# Patient Record
Sex: Male | Born: 1970 | Race: Black or African American | Hispanic: No | Marital: Married | Smoking: Never smoker
Health system: Southern US, Community
[De-identification: ages and names within clinical notes are randomized; demographics above are authoritative.]

## PROBLEM LIST (undated history)

## (undated) DIAGNOSIS — Z789 Other specified health status: Secondary | ICD-10-CM

## (undated) HISTORY — DX: Other specified health status: Z78.9

## (undated) HISTORY — PX: WRIST SURGERY: SHX841

## (undated) HISTORY — PX: CHOLECYSTECTOMY: SHX55

## (undated) HISTORY — PX: KNEE SURGERY: SHX244

---

## 2009-06-06 ENCOUNTER — Emergency Department: Admit: 2009-06-06 | Payer: Self-pay | Source: Emergency Department | Admitting: Pediatric Emergency Medicine

## 2009-08-17 ENCOUNTER — Emergency Department: Admit: 2009-08-17 | Payer: Self-pay | Source: Emergency Department | Admitting: Emergency Medicine

## 2009-08-25 ENCOUNTER — Ambulatory Visit: Admission: RE | Admit: 2009-08-25 | Payer: Self-pay | Source: Ambulatory Visit | Admitting: Gastroenterology

## 2009-08-25 ENCOUNTER — Ambulatory Visit: Payer: Self-pay

## 2009-08-28 LAB — LAB USE ONLY - HISTORICAL SURGICAL PATHOLOGY

## 2009-09-04 ENCOUNTER — Ambulatory Visit
Admit: 2009-09-04 | Disposition: A | Discharge status: Home / Self Care | Payer: Self-pay | Source: Ambulatory Visit | Admitting: Gastroenterology

## 2009-09-13 ENCOUNTER — Ambulatory Visit
Admit: 2009-09-13 | Disposition: A | Discharge status: Home / Self Care | Payer: Self-pay | Source: Ambulatory Visit | Admitting: Gastroenterology

## 2009-09-13 LAB — GFR: EGFR: >60

## 2009-09-13 LAB — COMPREHENSIVE METABOLIC PANEL
ALT: 75 U/L — ABNORMAL HIGH (ref 0–55)
AST (SGOT): 46 U/L — ABNORMAL HIGH (ref 5–34)
Albumin/Globulin Ratio: 1.1 (ref 0.9–2.2)
Albumin: 3.8 g/dL (ref 3.5–5.0)
Alkaline Phosphatase: 66 U/L (ref 40–150)
BUN: 6 mg/dL — ABNORMAL LOW (ref 9.0–21.0)
Bilirubin, Total: 0.9 mg/dL (ref 0.2–1.2)
CO2: 25 mEq/L (ref 22–29)
Calcium: 10.1 mg/dL (ref 8.5–10.5)
Chloride: 102 mEq/L (ref 98–107)
Creatinine: 0.9 mg/dL (ref 0.7–1.3)
Globulin: 3.5 g/dL (ref 2.0–3.6)
Glucose: 101 mg/dL — ABNORMAL HIGH (ref 70–100)
Potassium: 4.8 mEq/L (ref 3.5–5.1)
Protein, Total: 7.3 g/dL (ref 6.0–8.3)
Sodium: 137 mEq/L (ref 136–145)

## 2009-09-13 LAB — HEMOLYSIS INDEX: Hemolysis Index: 22

## 2009-09-13 LAB — CBC
Hematocrit: 46.6 % (ref 42.0–52.0)
Hgb: 16.4 g/dL (ref 13.0–17.0)
MCH: 34.8 pg — ABNORMAL HIGH (ref 28.0–32.0)
MCHC: 35.2 g/dL (ref 32.0–36.0)
MCV: 98.9 fL (ref 80.0–100.0)
MPV: 9.7 fL (ref 9.4–12.3)
Platelets: 212 10*3/uL (ref 140–400)
RBC: 4.71 10*6/uL (ref 4.70–6.00)
RDW: 12 % (ref 12–15)
WBC: 4.09 10*3/uL (ref 3.50–10.80)

## 2009-09-13 LAB — AMYLASE: Amylase: 81 U/L (ref 25–125)

## 2009-09-13 LAB — LIPASE: Lipase: 113 U/L — ABNORMAL HIGH (ref 8–78)

## 2009-10-27 ENCOUNTER — Ambulatory Visit: Admission: RE | Admit: 2009-10-27 | Payer: Self-pay | Source: Ambulatory Visit | Admitting: Gastroenterology

## 2009-10-28 LAB — LAB USE ONLY - HISTORICAL SURGICAL PATHOLOGY

## 2009-12-31 ENCOUNTER — Emergency Department: Admit: 2009-12-31 | Payer: Self-pay | Source: Emergency Department | Admitting: Emergency Medicine

## 2009-12-31 LAB — CBC AND DIFFERENTIAL
Baso(Absolute): 0.02 10*3/uL (ref 0.00–0.20)
Basophils: 0 % (ref 0–2)
Eosinophils Absolute: 0.16 10*3/uL (ref 0.00–0.70)
Eosinophils: 3 % (ref 0–5)
Hematocrit: 42.8 % (ref 42.0–52.0)
Hgb: 15.6 g/dL (ref 13.0–17.0)
Immature Granulocytes Absolute: 0.03 10*3/uL
Immature Granulocytes: 1 % (ref 0–1)
Lymphocytes Absolute: 1.89 10*3/uL (ref 0.50–4.40)
Lymphocytes: 37 % (ref 15–41)
MCH: 34.7 pg — ABNORMAL HIGH (ref 28.0–32.0)
MCHC: 36.4 g/dL — ABNORMAL HIGH (ref 32.0–36.0)
MCV: 95.1 fL (ref 80.0–100.0)
MPV: 9.4 fL (ref 9.4–12.3)
Monocytes Absolute: 0.46 10*3/uL (ref 0.00–1.20)
Monocytes: 9 % (ref 0–11)
Neutrophils Absolute: 2.57 10*3/uL
Neutrophils: 50 % — ABNORMAL LOW (ref 52–75)
Platelets: 217 10*3/uL (ref 140–400)
RBC: 4.5 10*6/uL — ABNORMAL LOW (ref 4.70–6.00)
RDW: 13 % (ref 12–15)
WBC: 5.1 10*3/uL (ref 3.50–10.80)

## 2009-12-31 LAB — COMPREHENSIVE METABOLIC PANEL
ALT: 53 U/L (ref 0–55)
AST (SGOT): 36 U/L — ABNORMAL HIGH (ref 5–34)
Albumin/Globulin Ratio: 1.2 (ref 0.9–2.2)
Albumin: 3.8 g/dL (ref 3.5–5.0)
Alkaline Phosphatase: 63 U/L (ref 40–150)
BUN: 5 mg/dL — ABNORMAL LOW (ref 9.0–21.0)
Bilirubin, Total: 0.9 mg/dL (ref 0.2–1.2)
CO2: 24 mEq/L (ref 22–29)
Calcium: 9.6 mg/dL (ref 8.5–10.5)
Chloride: 102 mEq/L (ref 98–107)
Creatinine: 0.9 mg/dL (ref 0.7–1.3)
Globulin: 3.3 g/dL (ref 2.0–3.6)
Glucose: 100 mg/dL (ref 70–100)
Potassium: 4.1 mEq/L (ref 3.5–5.1)
Protein, Total: 7.1 g/dL (ref 6.0–8.3)
Sodium: 137 mEq/L (ref 136–145)

## 2009-12-31 LAB — HEMOLYSIS INDEX: Hemolysis Index: 15 Index (ref 0–18)

## 2009-12-31 LAB — LIPASE: Lipase: 42 U/L (ref 8–78)

## 2009-12-31 LAB — GFR: EGFR: >60

## 2010-01-03 ENCOUNTER — Ambulatory Visit
Admit: 2010-01-03 | Disposition: A | Discharge status: Home / Self Care | Payer: Self-pay | Source: Ambulatory Visit | Admitting: Family

## 2010-01-11 ENCOUNTER — Ambulatory Visit: Admission: RE | Admit: 2010-01-11 | Payer: Self-pay | Source: Ambulatory Visit | Admitting: Surgery

## 2010-01-12 LAB — LAB USE ONLY - HISTORICAL SURGICAL PATHOLOGY

## 2010-06-07 ENCOUNTER — Ambulatory Visit: Admission: RE | Admit: 2010-06-07 | Payer: Self-pay | Source: Ambulatory Visit | Admitting: Urology

## 2010-06-08 LAB — LAB USE ONLY - HISTORICAL SURGICAL PATHOLOGY

## 2010-07-18 ENCOUNTER — Emergency Department: Admit: 2010-07-18 | Payer: Self-pay | Source: Emergency Department | Admitting: Emergency Medicine

## 2010-07-19 LAB — COMPREHENSIVE METABOLIC PANEL
ALT: 49 U/L (ref 0–55)
AST (SGOT): 47 U/L — ABNORMAL HIGH (ref 5–34)
Albumin/Globulin Ratio: 0.9 (ref 0.9–2.2)
Albumin: 3.8 g/dL (ref 3.5–5.0)
Alkaline Phosphatase: 60 U/L (ref 40–150)
Anion Gap: 14 (ref 5.0–15.0)
BUN: 5 mg/dL — ABNORMAL LOW (ref 9.0–21.0)
Bilirubin, Total: 0.8 mg/dL (ref 0.2–1.2)
CO2: 21 mEq/L — ABNORMAL LOW (ref 22–29)
Calcium: 9.1 mg/dL (ref 8.5–10.5)
Chloride: 102 mEq/L (ref 98–107)
Creatinine: 0.9 mg/dL (ref 0.7–1.3)
Globulin: 4.4 g/dL — ABNORMAL HIGH (ref 2.0–3.6)
Glucose: 120 mg/dL — ABNORMAL HIGH (ref 70–100)
Potassium: 5.4 mEq/L — ABNORMAL HIGH (ref 3.5–5.1)
Protein, Total: 8.2 g/dL (ref 6.0–8.3)
Sodium: 137 mEq/L (ref 136–145)

## 2010-07-19 LAB — GFR: EGFR: >60

## 2010-07-19 LAB — LIPASE: Lipase: 35 U/L (ref 8–78)

## 2010-07-19 LAB — HEMOLYSIS INDEX: Hemolysis Index: 341 Index — ABNORMAL HIGH (ref 0–18)

## 2010-10-20 ENCOUNTER — Emergency Department: Admit: 2010-10-20 | Payer: Self-pay | Source: Emergency Department | Admitting: Emergency Medicine

## 2010-10-20 LAB — LIPASE: Lipase: 23 U/L (ref 8–78)

## 2010-10-20 LAB — COMPREHENSIVE METABOLIC PANEL
ALT: 37 U/L (ref 0–55)
AST (SGOT): 21 U/L (ref 5–34)
Albumin/Globulin Ratio: 1.2 (ref 0.9–2.2)
Albumin: 3.8 g/dL (ref 3.5–5.0)
Alkaline Phosphatase: 59 U/L (ref 40–150)
Anion Gap: 9 (ref 5.0–15.0)
BUN: 6 mg/dL — ABNORMAL LOW (ref 9.0–21.0)
Bilirubin, Total: 0.9 mg/dL (ref 0.2–1.2)
CO2: 24 mEq/L (ref 22–29)
Calcium: 9.4 mg/dL (ref 8.5–10.5)
Chloride: 103 mEq/L (ref 98–107)
Creatinine: 0.8 mg/dL (ref 0.7–1.3)
Globulin: 3.3 g/dL (ref 2.0–3.6)
Glucose: 106 mg/dL — ABNORMAL HIGH (ref 70–100)
Potassium: 4 mEq/L (ref 3.5–5.1)
Protein, Total: 7.1 g/dL (ref 6.0–8.3)
Sodium: 136 mEq/L (ref 136–145)

## 2010-10-20 LAB — CBC AND DIFFERENTIAL
Baso(Absolute): 0.02 10*3/uL (ref 0.00–0.20)
Basophils: 0 % (ref 0–2)
Eosinophils Absolute: 0.15 10*3/uL (ref 0.00–0.70)
Eosinophils: 3 % (ref 0–5)
Hematocrit: 42.3 % (ref 42.0–52.0)
Hgb: 15.1 g/dL (ref 13.0–17.0)
Immature Granulocytes Absolute: 0.03 10*3/uL
Immature Granulocytes: 1 % (ref 0–1)
Lymphocytes Absolute: 1.58 10*3/uL (ref 0.50–4.40)
Lymphocytes: 31 % (ref 15–41)
MCH: 34.3 pg — ABNORMAL HIGH (ref 28.0–32.0)
MCHC: 35.7 g/dL (ref 32.0–36.0)
MCV: 96.1 fL (ref 80.0–100.0)
MPV: 9.1 fL — ABNORMAL LOW (ref 9.4–12.3)
Monocytes Absolute: 0.67 10*3/uL (ref 0.00–1.20)
Monocytes: 13 % — ABNORMAL HIGH (ref 0–11)
Neutrophils Absolute: 2.75 10*3/uL
Neutrophils: 53 % (ref 52–75)
Platelets: 201 10*3/uL (ref 140–400)
RBC: 4.4 10*6/uL — ABNORMAL LOW (ref 4.70–6.00)
RDW: 13 % (ref 12–15)
WBC: 5.17 10*3/uL (ref 3.50–10.80)

## 2010-10-20 LAB — URINALYSIS, REFLEX TO MICROSCOPIC EXAM IF INDICATED
Bilirubin, UA: NEGATIVE
Blood, UA: NEGATIVE
Glucose, UA: NEGATIVE
Ketones UA: NEGATIVE
Nitrite, UA: NEGATIVE
Protein, UR: NEGATIVE
RBC, UA: <1 /HPF (ref 0–5)
Specific Gravity UA POCT: 1.01 (ref 1.001–1.035)
Urine pH: 7 (ref 5.0–8.0)
Urobilinogen, UA: 2 mg/dL
WBC, UA: <1 /HPF (ref 0–5)

## 2010-10-20 LAB — HEMOLYSIS INDEX: Hemolysis Index: 9 Index (ref 0–18)

## 2010-10-20 LAB — GFR: EGFR: >60

## 2011-05-21 LAB — ECG 12-LEAD
Atrial Rate: 74 {beats}/min
P Axis: 33 degrees
P-R Interval: 158 ms
Q-T Interval: 364 ms
QRS Duration: 80 ms
QTC Calculation (Bezet): 404 ms
R Axis: 14 degrees
T Axis: 28 degrees
Ventricular Rate: 74 {beats}/min

## 2011-05-26 LAB — ECG 12-LEAD
Atrial Rate: 92 {beats}/min
P Axis: 46 degrees
P-R Interval: 146 ms
Q-T Interval: 340 ms
QRS Duration: 84 ms
QTC Calculation (Bezet): 420 ms
R Axis: 23 degrees
T Axis: 35 degrees
Ventricular Rate: 92 {beats}/min

## 2011-06-28 NOTE — Op Note (Signed)
Account Number: [id number]      Document ID: [id number]      Admit Date: 08/25/2009      Procedure Date: 08/25/2009            Patient Location: DISCHARGED 08/25/2009      Patient Type: A            SURGEON: Agustin Cree MD      ASSISTANT:                  PROCEDURE:      Upper endoscopy with biopsies.            MEDICATIONS:      Per anesthesia.            INSTRUMENT:      Standard Olympus upper endoscope.            COMPLICATIONS:      None.            PREOPERATIVE DIAGNOSIS:      Abdominal pain.            POSTOPERATIVE DIAGNOSES:      1.  Moderate gastritis in the body and antrum, status post random gastric      biopsy.      2.  Irregular Z-line.      3.  No evidence of peptic ulcer disease.            DESCRIPTION OF PROCEDURE:      The patient had previously been given written informed consent for the      procedure with full knowledge of the risks including bowel perforation,      bleeding, infection, and cardiac arrest.  The patient was placed in left      lateral decubitus position.  She was given intravenous sedation in      incremental doses.  When adequate sedation was obtained, the standard      Olympus upper endoscope was advanced under direct visualization through the      mouth all the way to the third portion of the duodenum.  All areas were      closely examined upon withdrawal of the scope.  The patient tolerated the      procedure well and went to the recovery room in stable condition.            FINDINGS:      The upper, middle, and lower third of the esophagus were examined and      appeared normal.  The Z-line was seen and appeared irregular.  Upon entry      into the stomach, a gastric distention was noted.  There is moderate      erythema seen in the gastric body and antrum.  There was no evidence of      peptic ulcer disease.  Retroflex views revealed no abnormalities in the      fundus or cardia.  With standard biopsy forceps, random gastric biopsies      were obtained and sent to pathology.   The scope was then advanced to the      first, second, and third portion of the duodenum, which appeared normal.      The scope was then withdrawn and the procedure was complete.            RECOMMENDATIONS:      1.  Await pathology results.  The patient to call in 1 week to discuss.      2.  Avoid aspirin, Aleve, Motrin, NSAIDS, ibuprofen.      3.  Continue current medications.                        Electronic Signing Provider      _______________________________     Date/Time Signed: _____________      Agustin Cree MD ([id number])            D:  08/25/2009 15:45 PM by Agustin Cree, MD ([id number])      T:  08/26/2009 17:11 PM by [id number]          Everlean Cherry: [id number]) (Doc ID: [id number])                  MV:[id number] Reather Laurence MD

## 2011-06-28 NOTE — Op Note (Signed)
DURAN, OHERN      MRN:          [id number]      Account:      [id number]      Document ID:  [id number] 08/21/1971      Procedure Date: 01/11/2010            Admit Date: 01/11/2010            Patient Location: DISCHARGED 01/11/2010      Patient Type: A            SURGEON: Rickey Primus MD      ASSISTANT:                  PREOPERATIVE DIAGNOSIS:      History of abdominal pain.            POSTOPERATIVE DIAGNOSIS:      History of abdominal pain.            TITLE OF PROCEDURE:      Laparoscopic cholecystectomy, intraoperative cholangiogram.            ANESTHESIA:      General.            INDICATIONS FOR PROCEDURE:      This is a male with unexplained right upper abdominal pain with mild      elevation in his amylase.  He had undergone a workup, which was negative.      On review of his recurrent symptoms, consideration was made for      cholecystectomy and he did have some elevation of his alloys.  He also had      a slightly decreased ejection fraction.  The risk of bleeding, infection,      open, damage to bile ducts, and damage to intestines were all discussed.  I      had also discussed with the patient that this may not help.  He understood      and wished to proceed.            DESCRIPTION OF PROCEDURE:      The patient was identified.  General anesthesia was administered.  He was      prepped and draped in a standard fashion.  He had pneumatic compression      devices on his lower extremities.  He was given IV antibiotics.  At the      level of the umbilicus using a cut-down technique, the abdomen was entered      atraumatically, and a Hasson trocar was introduced and a 30-degree scope      was used.  A 5-mm port was placed at the subxiphoid, and two 5-mm ports      placed in the right upper quadrant after transilluminating.  The      gallbladder was slightly intrahepatic.  There were some small adhesions to      the lower part of his gallbladder.  The gallbladder was retracted as best      as possible over top of  the liver close to the cystic duct.  It was      retracted laterally, staying high on the gallbladder, the junction of the      gallbladder.  The cystic duct was defined laterally, posteriorly, and      medially.  The cystic artery was dissected free.  The cystic duct was small  Page 1 of 2      MOYSES, PAVEY      MRN:          [id number]      Account:      [id number]      Document ID:  [id number]      Procedure Date: 01/11/2010            in nature.  An intraoperative cholangiogram was performed due to this      history of pancreatitis after we had obtained a critical view and that      there was a liver edge, the cystic artery, and cystic duct.  This showed      flow going up into the right and left hepatic ducts, common hepatic duct,      common bile duct, and into the duodenum.  Initially, it is a question of      filling defect, but ultimately it was felt to be a bubble.  The cystic duct      was clipped.  The cystic artery was clipped and divided.  The gallbladder      was taken off the liver bed.  In the gallbladder fossa, there was a small      arterial bleeder that was grasped and controlled with 2 clips.  It now      appeared that we had good hemostasis.  The gallbladder was further taken      off using electrocautery and removed at the umbilicus.  Hemostasis was      assured.  It was noted that there was a small liver tear close to false      _____, but that this was not actively _____.  The fascia at the umbilicus      was closed with 0 Vicryl suture using laparoscopic technique.  The 5-mm      ports were removed laterally under visualization.  Skin was closed with      Monocryl.  Steri-Strips were applied.  The patient was stable throughout.                        Electronic Signing Provider            D:  01/11/2010 21:43 PM by Dr. Carole Binning. Elmon Else, MD [phone])      T:  01/11/2010 23:45 PM by [id number]                  YQ:[id number] Reather Laurence MD      Marlowe Aschoff MD                                    Page 2 of 2      Authenticated by Rickey Primus, MD On 02/08/2010 08:51:59 AM

## 2011-06-28 NOTE — Op Note (Signed)
William Day, William Day      MRN:          [id number]      Account:      [id number]      Document ID:  [id number]      Procedure Date: 06/07/2010            Admit Date: 06/07/2010            Patient Location: [facility]      Patient Type: A            SURGEON: Prescilla Sours MD      ASSISTANT:                  PREOPERATIVE DIAGNOSIS:      Bilateral varicocele.            POSTOPERATIVE DIAGNOSES:      1.  Bilateral varicocele.      2.  Right inguinal hernia.            TITLE OF PROCEDURE:      1.  Bilateral varicocelectomy.      2.  Right inguinal hernia repair.            ANESTHESIA:      General.            ESTIMATED BLOOD LOSS:      Minimal.            COMPLICATIONS:      None.            INDICATIONS:      This is a 40 year old male who had a vague abdominal pain and lower groin      pain and on examination, patient noted to have bilateral varicocele grade      III in the left and a grade II in the right.  The patient is scheduled for      bilateral varicocelectomy.  All risks and complications were discussed at      length with the patient including infection, bleeding, persistent      testicular pain, testicular atrophy, etc.  The patient agreed to the above.            DESCRIPTION OF PROCEDURE:      The patient was taken to the operating room and after achievement of      effective general anesthesia, prophylactic IV antibiotics were given.      Then, lower abdomen, genitalia, and perineum were prepped and draped in      usual sterile fashion.  A left groin incision was made along the skin      crease and extended for a length of 5 cm.  Skin incision was carried down      through the subcutaneous tissue.  Superficial vessels were ligated with      silk 2-0 suture and divided.  The external oblique ligament was identified,                                   Page 1 of 2      William Day, William Day      MRN:          [id number]      Account:      [id number]      Document ID:  [id number]      Procedure Date: 06/07/2010  then was opened sharply along the direction of is ligament down to the      external ring and up to the level of the internal ring.  Ilioinguinal nerve      was identified and preserved.  Then, dissection continued where the cord      structure was fully mobilized at the pubic bone.  Then, a Penrose drain was      placed underneath it for retraction.  External spermatic fascia was opened      sharply in longitudinal fashion.  Then the vas deferens was identified and      preserved.  Patient noticed to have a few dilated varicose veins around the      vas deferens, which were isolated, ligated.            Then, attention directed to the other veins, which some of them ranging in      size between 3-mm to size 10 mm.  All the veins were isolated, doubly      ligated with silk 2-0 suture and divided.  A segment was sent for      pathological identification.  Further inspection revealed no evidence of      any veins seen, any dilated veins.  There is no perforating vessels in the      inguinal canal.  Then, the cord structure was placed back in normal      position.  The ilioinguinal nerve was freed and infiltrated with Marcaine      0.5%.  External oblique ligament was closed in interrupted fashion with      Vicryl 2-0 suture.  Subcutaneous tissue was closed in interrupted fashion      with a plain 3-0 suture.  Skin was infiltrated with Marcaine 0.5% and then      closed in subcuticular running fashion with Monocryl 4-0 suture.  Attention      directed to the right side where in similar fashion, patient was noticed to      have multiple varicose veins ranging in size between 2 mm to size 10 mm.      Also, patient noticed to have right inguinal hernia.  All the vessels were      isolated, doubly ligated with silk 2-0 suture and divided.  Again, the vas      deferens and the spermatic artery was identified and preserved.  Then, the      right inguinal hernia repair was closed in interrupted fashion with the       Vicryl 2-0 suture.  The cord structure was placed again over the inguinal      canal and the ilioinguinal nerve was infiltrated with Marcaine 0.5%.  The      wound was closed in a similar fashion where the fascia closed in      interrupted fashion with Vicryl 2-0 suture.  Subcutaneous tissue closed      with a plain 3-0 suture.  Skin was closed in subcuticular running fashion      with Monocryl 4-0 suture.  Steri-Strips and a dry dressing applied.  The      patient tolerated the procedure well and transferred to the recovery room      in satisfactory condition.                        Electronic Signing Provider            D:  06/07/2010 09:48 AM by  Dr. Ann Held. Karel Jarvis, MD [phone])      T:  06/07/2010 13:03 PM by [id number]                  cc:                                   Page 2 of 2      Authenticated by Prescilla Sours, MD On 06/07/2010 06:17:37 PM

## 2011-06-28 NOTE — Op Note (Unsigned)
William Day, William Day      MRN:          [id number]      Account:      [id number]      Document ID:  [id number] [phone]      Procedure Date: 10/27/2009            Admit Date: 10/27/2009            Patient Location: DISCHARGED 10/27/2009      Patient Type: A            SURGEON: Agustin Cree MD      ASSISTANT:                  TITLE OF PROCEDURE:      Colonoscopy with biopsies.            COMPLICATIONS:      None.            MEDICATIONS:      Per anesthesia.            INSTRUMENT:      Standard Olympus colonoscope.            PREOPERATIVE DIAGNOSES:      1.  Rectal bleeding.      2.  Abdominal pain.            POSTOPERATIVE DIAGNOSES:      1.  Questionable inverted appendiceal orifice, status post biopsy.      2.  Internal hemorrhoids.      3. Fair prep.            DESCRIPTION OF PROCEDURE:      The patient had previously been given written informed consent for the      procedure with full knowledge of the risks including bowel perforation,      bleeding, infection and cardiac arrest.  The patient was placed in the left      lateral decubitus position.  She was given intravenous sedation in      incremental doses.  When adequate sedation was obtained, the standard      Olympus colonoscope was advanced under direct visualization through the      anus all the way to the terminal ileum.  All areas were closely examined      upon withdrawal of the scope.  The patient tolerated the procedure well and      went to the recovery room in stable condition.  Cecal withdrawal time was      10 minutes.            FINDINGS:      Rectal exam was unremarkable.  Prep was adequate; however, there was a      large amount of thick liquid stool seen in the right and transverse colon                                   Page 1 of 2      William Day, William Day      MRN:          [id number]      Account:      [id number]      Document ID:  [id number]      Procedure Date: 10/27/2009            which limited visualization to a certain degree.  No large  masses or  lesions were seen, but certainly smaller polyps could have easily been      missed.  The terminal ileum was intubated for a short distance, appeared      normal.  The cecum was identified by the appendiceal orifice, ileocecal      valve, which were photographed.  Upon examination of the appendiceal      orifice, there was a slight protrusion noted suggestive of an inverted      appendiceal orifice.  Biopsies were obtained and sent to pathology.  No      obvious adenoma was seen.  The cecum, ascending colon, hepatic flexure,      transverse colon, descending colon and sigmoid colon were unremarkable.  In      the rectum, upon retroflexion, moderate internal hemorrhoids were seen,      which were likely the patient's cause of bleeding, The scope was then      withdrawn, and the procedure was complete.            RECOMMENDATIONS:      1.  Await pathology results.      2.  Continue MiraLax as directed.      3.  Follow up in 1 week to discuss pathology results.                        Electronic Signing Provider            D:  10/27/2009 14:22 PM by Agustin Cree, MD ([id number])      T:  10/28/2009 10:44 AM by [email or web address]                  cc:     Bernadene Person MD                                   Page 2 of 2      Authenticated and Edited by Agustin Cree, MD On 11/07/09 11:48:37 AM

## 2011-09-04 ENCOUNTER — Emergency Department
Admit: 2011-09-04 | Discharge: 2011-09-04 | Disposition: A | Discharge status: Home / Self Care | Payer: Self-pay | Source: Emergency Department | Admitting: Emergency Medicine

## 2011-09-04 LAB — CBC AND DIFFERENTIAL
Basophils Absolute Automated: 0.01 10*3/uL (ref 0.00–0.20)
Basophils Automated: 0 % (ref 0–2)
Eosinophils Absolute Automated: 0.09 10*3/uL (ref 0.00–0.70)
Eosinophils Automated: 2 % (ref 0–5)
Hematocrit: 40.3 % — ABNORMAL LOW (ref 42.0–52.0)
Hgb: 14.4 g/dL (ref 13.0–17.0)
Immature Granulocytes Absolute: 0.01 10*3/uL
Immature Granulocytes: 0 % (ref 0–1)
Lymphocytes Absolute Automated: 1.5 10*3/uL (ref 0.50–4.40)
Lymphocytes Automated: 27 % (ref 15–41)
MCH: 33.2 pg — ABNORMAL HIGH (ref 28.0–32.0)
MCHC: 35.7 g/dL (ref 32.0–36.0)
MCV: 92.9 fL (ref 80.0–100.0)
MPV: 9.1 fL — ABNORMAL LOW (ref 9.4–12.3)
Monocytes Absolute Automated: 0.73 10*3/uL (ref 0.00–1.20)
Monocytes: 13 % — ABNORMAL HIGH (ref 0–11)
Neutrophils Absolute: 3.3 10*3/uL (ref 1.80–8.10)
Neutrophils: 59 % (ref 52–75)
Nucleated RBC: 0 /100 WBC
Platelets: 223 10*3/uL (ref 140–400)
RBC: 4.34 10*6/uL — ABNORMAL LOW (ref 4.70–6.00)
RDW: 13 % (ref 12–15)
WBC: 5.63 10*3/uL (ref 3.50–10.80)

## 2011-09-04 LAB — COMPREHENSIVE METABOLIC PANEL
ALT: 51 U/L (ref 0–55)
AST (SGOT): 35 U/L — ABNORMAL HIGH (ref 5–34)
Albumin/Globulin Ratio: 1.2 (ref 0.9–2.2)
Albumin: 3.9 g/dL (ref 3.5–5.0)
Alkaline Phosphatase: 65 U/L (ref 40–150)
Anion Gap: 10 (ref 5.0–15.0)
BUN: 6 mg/dL — ABNORMAL LOW (ref 9.0–21.0)
Bilirubin, Total: 0.8 mg/dL (ref 0.2–1.2)
CO2: 24 mEq/L (ref 22–29)
Calcium: 9.7 mg/dL (ref 8.5–10.5)
Chloride: 100 mEq/L (ref 98–107)
Creatinine: 0.9 mg/dL (ref 0.7–1.3)
Globulin: 3.2 g/dL (ref 2.0–3.6)
Glucose: 108 mg/dL — ABNORMAL HIGH (ref 70–100)
Potassium: 3.5 mEq/L (ref 3.5–5.1)
Protein, Total: 7.1 g/dL (ref 6.0–8.3)
Sodium: 134 mEq/L — ABNORMAL LOW (ref 136–145)

## 2011-09-04 LAB — HEMOLYSIS INDEX: Hemolysis Index: 15 Index (ref 0–18)

## 2011-09-04 LAB — GFR: EGFR: >60

## 2011-09-04 LAB — LIPASE: Lipase: 39 U/L (ref 8–78)

## 2011-09-05 ENCOUNTER — Emergency Department
Admit: 2011-09-05 | Discharge: 2011-09-05 | Disposition: A | Discharge status: Home / Self Care | Payer: Self-pay | Source: Emergency Department | Admitting: Emergency Medicine

## 2015-01-30 ENCOUNTER — Emergency Department: Payer: BLUE CROSS/BLUE SHIELD

## 2015-01-30 ENCOUNTER — Emergency Department
Admission: EM | Admit: 2015-01-30 | Discharge: 2015-01-30 | Disposition: A | Discharge status: Home / Self Care | Payer: BLUE CROSS/BLUE SHIELD | Attending: Emergency Medicine | Admitting: Emergency Medicine

## 2015-01-30 DIAGNOSIS — M545 Low back pain, unspecified: Secondary | ICD-10-CM

## 2015-01-30 DIAGNOSIS — F17210 Nicotine dependence, cigarettes, uncomplicated: Secondary | ICD-10-CM | POA: Insufficient documentation

## 2015-01-30 DIAGNOSIS — Z9049 Acquired absence of other specified parts of digestive tract: Secondary | ICD-10-CM | POA: Insufficient documentation

## 2015-01-30 LAB — POCT URINALYSIS AUTOMATED (IAH)
Bilirubin, UA POCT: NEGATIVE
Blood, UA POCT: NEGATIVE
Glucose, UA POCT: NEGATIVE
Ketones, UA POCT: NEGATIVE mg/dL
Nitrite, UA POCT: NEGATIVE
PH, UA POCT: 7.5 (ref 4.6–8)
Protein, UA POCT: NEGATIVE mg/dL
Specific Gravity, UA POCT: 1.02 mg/dL (ref 1.001–1.035)
Urine Leukocytes POCT: NEGATIVE
Urobilinogen, UA POCT: 1 mg/dL

## 2015-01-30 MED ORDER — CYCLOBENZAPRINE HCL 10 MG PO TABS
10.0000 mg | ORAL_TABLET | Freq: Three times a day (TID) | ORAL | Status: AC | PRN
Start: 2015-01-30 — End: 2015-02-11

## 2015-01-30 MED ORDER — IBUPROFEN 600 MG PO TABS
600.0000 mg | ORAL_TABLET | Freq: Four times a day (QID) | ORAL | Status: DC | PRN
Start: 2015-01-30 — End: 2016-11-30

## 2015-01-30 MED ORDER — IBUPROFEN 600 MG PO TABS
600.0000 mg | ORAL_TABLET | Freq: Once | ORAL | Status: AC
Start: 2015-01-30 — End: 2015-01-30
  Administered 2015-01-30: 600 mg via ORAL
  Filled 2015-01-30: qty 1

## 2015-01-30 NOTE — Discharge Instructions (Signed)
Back Pain, Lumbar NOS     You have been seen for low back pain.      This area is also called the lumbar spine.     Pain in the low back is very a common problem. This pain is usually due to overuse of the muscles, tension or a strain of the muscles. There can also be damage to the discs that cushion the bones in the spine. This can cause irritation of the nerves that exit the spinal canal in the low back.     Your doctor did not find any pain over the bones in your back (even though you might have pain in the back muscles). This means it is very unlikely that you have a broken bone in your back. Your doctor did not think it was necessary to take an x-ray.     The doctor still does not know the exact cause of your pain. Your problem does not seem to be from a dangerous cause. It is OK for you to go home today.     Some things you can try to help your back feel better are:  · Apply a warm damp washcloth to the back for 20 minutes at a time, at least 4 times per day. This will reduce your pain. Massaging your back might also help.  · Have someone massage the sore parts of your back.  · Don't do any heavy lifting or bending. You can go back to normal daily activities if they don't make the pain worse.  · Use the over-the-counter anti-inflammatory medication ibuprofen (also known as Advil® or Motrin®) as directed on the package to help with pain and inflammation.     It is normal for the pain to last for the next few days. If your pain gets better, you probably do not need to see a doctor. However, if your symptoms get worse or you have new symptoms, you should return here or go to the nearest Emergency Department.     Call your doctor or go to the nearest Emergency Department if you your pain does not improve or your pain is bad enough to seriously limit your normal activities.     YOU SHOULD SEEK MEDICAL ATTENTION IMMEDIATELY, EITHER HERE OR AT THE NEAREST EMERGENCY DEPARTMENT, IF ANY OF THE FOLLOWING OCCURS:  · You  think the pain is coming from somewhere other than your back. This can include pelvic pain. This can be from infections in the pelvis or lower belly.  · You have abdominal (belly) pain that goes through to your back.  · Your legs tingle or get numb (lose feeling).  · Your legs are weak.  · You have fever (temperature higher than 100.4ºF / 38ºC) along with back pain.  · Your back pain is getting worse.  · You lose control of your bladder or bowels. If this were to happen, it may cause you to wet or soil yourself.  · You have problems urinating (peeing).

## 2015-01-30 NOTE — ED Notes (Signed)
Pt c/o lower left back pain x 1 day.  Pt denies falls, injury,denies any radiating pain down the legs, n/v/d and uninary sx.

## 2015-01-30 NOTE — ED Provider Notes (Signed)
EMERGENCY DEPARTMENT HISTORY AND PHYSICAL EXAM     Physician/Midlevel provider first contact with patient: 01/30/15 1516         Date: 01/30/2015  Patient Name: William Day    History of Presenting Illness     Chief Complaint   Patient presents with   . Back Pain       History Provided By: Patient    Chief Complaint: Back pain  Onset: yesterday  Timing: gradual onset, constant  Location: midline and l side of back, non-radiating  Quality: sharp  Severity: 7-8/10  Modifying Factors: worsens with any change of position  Associated Symptoms: Denies known injury    Additional History: William Day is a 44 y.o. male who presents with pain to midline and left side of low back which has gradually worsening since onset yesterday afternoon. Patient denies known injury, however patient does work with [employer] team and carries heavy gear on a regular basis. Patient states he has also been increasing the intensity of his exercise regimen within the past few weeks, which includes lifting and running stairs. Pain is sharp in quality and worsens with any change in position/movement. Patient has not attempted analgesia PTA. Denies radiation. Patient denies any numbness, LE weakness, paresthesias, saddle anesthesia, loss of bladder/bowel control, urinary retention, previous lumbar surgery, IVDU, chronic steroid use. Denies flank pain, hematuria, h/o kidney stones.     PCP: Bonnielee Haff, MD      No current facility-administered medications for this encounter.     Current Outpatient Prescriptions   Medication Sig Dispense Refill   . cyclobenzaprine (FLEXERIL) 10 MG tablet Take 1 tablet (10 mg total) by mouth 3 (three) times daily as needed for Muscle spasms. 12 tablet 0   . ibuprofen (ADVIL,MOTRIN) 600 MG tablet Take 1 tablet (600 mg total) by mouth every 6 (six) hours as needed for Pain or Fever. 30 tablet 0       Past History     Past Medical History:  History reviewed. No pertinent past medical history.    Past  Surgical History:  Past Surgical History   Procedure Laterality Date   . Cholecystectomy     . Arthroscopy knee, meniscus repair Right        Family History:  No family history on file.    Social History:  History   Substance Use Topics   . Smoking status: Current Some Day Smoker     Types: Cigars   . Smokeless tobacco: Not on file   . Alcohol Use: No       Allergies:  No Known Allergies    Review of Systems     Review of Systems   Constitutional: Negative for fever.   Gastrointestinal: Negative for nausea, vomiting and abdominal pain.   Genitourinary: Negative for dysuria, urgency, frequency and hematuria.   Musculoskeletal: Positive for back pain. Negative for joint pain and neck pain.   Neurological: Negative for tingling, sensory change and focal weakness.         Physical Exam   BP 139/79 mmHg  Pulse 80  Temp(Src) 98.6 F (37 C) (Oral)  Resp 16  Wt 99 kg  SpO2 98%    Physical Exam   Constitutional: He is oriented to person, place, and time. He appears well-developed and well-nourished. No distress.   Cardiovascular: Normal rate, regular rhythm and normal heart sounds.    Pulmonary/Chest: Effort normal and breath sounds normal. No respiratory distress. He has no wheezes. He has  no rales.   Abdominal: He exhibits no pulsatile midline mass. There is no tenderness. There is no CVA tenderness.   Musculoskeletal:        Cervical back: He exhibits normal range of motion, no tenderness, no bony tenderness, no swelling, no deformity and no spasm.        Thoracic back: He exhibits no tenderness, no bony tenderness, no swelling and no deformity.        Lumbar back: He exhibits normal range of motion, no tenderness, no bony tenderness, no swelling, no deformity and no spasm.   Negative SLR B/L. Appears comfortable at rest but appears to be in pain when asked to change position.   Neurological: He is alert and oriented to person, place, and time. He has normal strength. He displays no atrophy and no tremor. No sensory  deficit. He exhibits normal muscle tone. Gait normal. GCS eye subscore is 4. GCS verbal subscore is 5. GCS motor subscore is 6.   Reflex Scores:       Patellar reflexes are 2+ on the right side and 2+ on the left side.       Achilles reflexes are 2+ on the right side and 2+ on the left side.  Distal sensation in tact to light touch. Strength 5/5 B/L EHL, gastrocnemius, hamstrings, quadriceps.   Nursing note and vitals reviewed.        Diagnostic Study Results     Labs -     Results     Procedure Component Value Units Date/Time    UA Clinitek AX (urine dipstick) - POCT [[id number]] Collected:  01/30/15 1630    Specimen Information:  Urine Updated:  01/30/15 1632     Color UA POCT Yellow      Clarity UA POCT Clear      Glucose, UA POCT Negative      Bilirubin, UA POCT Negative      Ketones, UA POCT Negative mg/dL      Specific Gravity, UA POCT 1.020 mg/dL      Blood, UA POCT  Negative      PH, UA POCT 7.5      Protein, UA POCT Negative mg/dL      Urobilinogen, UA POCT 1.0 mg/dL      Nitrite, UA POCT Negative      Leukocytes, UA POCT Negative           Radiologic Studies -   Radiology Results (24 Hour)     ** No results found for the last 24 hours. **      .      Medical Decision Making   I am the first provider for this patient.    I reviewed the vital signs, available nursing notes, past medical history, past surgical history, family history and social history.    Vital Signs-Reviewed the patient's vital signs.     Patient Vitals for the past 12 hrs:   BP Temp Pulse Resp   01/30/15 1501 139/79 mmHg 98.6 F (37 C) 80 16       Pulse Oximetry Analysis - Normal 98% on RA      Old Medical Records: Nursing notes.     ED Course:     Provider Notes: 44 y/o M with atraumatic low back pain. Pain likely musculoskeletal in nature, as it worsens with movement, bending, and patient feels stiff. Patient has also been increasing intensity of exercise regimen and carries heavy equipment at his job. Other diagnosis considered includes  kidney stone, esp since patient was non-tender on exam, however patient without blood in urine and without previous h/o stone, so this is unlikely. Patient neuro non-focal, no red flags for low back pain, imaging deferred today. Ibuprofen and flexeril. Instructed to abstain from heavy lifting. F/u with PCP. Return precautions discussed. Case d/w Dr Meryl Crutch.     Procedures:    Core Measures:    Critical Care Time:     Diagnosis     Clinical Impression:   1. Left-sided low back pain without sciatica        _______________________________                Elray Mcgregor, PA  01/30/15 2050

## 2015-02-02 ENCOUNTER — Encounter (INDEPENDENT_AMBULATORY_CARE_PROVIDER_SITE_OTHER): Payer: Self-pay | Admitting: Internal Medicine

## 2015-02-02 ENCOUNTER — Ambulatory Visit (INDEPENDENT_AMBULATORY_CARE_PROVIDER_SITE_OTHER): Payer: BLUE CROSS/BLUE SHIELD | Admitting: Internal Medicine

## 2015-02-02 VITALS — BP 117/78 | HR 73 | Temp 98.1°F | Ht 66.5 in | Wt 219.0 lb

## 2015-02-02 DIAGNOSIS — M545 Low back pain, unspecified: Secondary | ICD-10-CM

## 2015-02-02 NOTE — Progress Notes (Signed)
Subjective:       Patient ID: William Day is a 44 y.o. male.  Seen in clinic on Feb 02, 2015.  Chief Complaint   Patient presents with   . Muscle Pain   . Back Pain       HPI   Patient is a 45 year old African American male with no chronic medical problems who presents today to establish care and also for followup after recent visit to the emergency department.    Patient reports he was in his usual state of health until 05/23 when he got up from falling asleep on the sofa and woke up with sharp pain in his mid left lower back.  Initially, he reported the pain was approximately 8/10 in intensity.   He denied having any problems with fevers, chills, dysuria, hematuria, urinary frequency or urgency, nausea, vomiting, dizziness or lightheadedness at the time.  He went to the [facility] ED for evaluation where it was thought that his symptoms are secondary to a muscular etiology and he was given a prescription for anti-inflammatory and also Flexeril.  He did report that he had not been doing any heavy lifting outside of his usual but admitted that about a day before he had been moving some objects around the garage.  He has not had any falls or recent trauma.    6 his visit to the emergency department he reports he has been taking Flexeril every 8 hours or so but has not been taking the ibuprofen as prescribed (his only taken about 2 doses or so).  He reports that his pain is now down to an average of 8/10 but continues to be present.  He works in a [employer] and is on leave until 02/07/15.  Pain is worse w/ certain movement and leaning forward.     The following portions of the patient's history were reviewed and updated as appropriate: allergies, current medications, past family history, past medical history, past social history, past surgical history and problem list.     History reviewed. No pertinent past medical history.    Past Surgical History   Procedure Laterality Date   . Cholecystectomy     . Arthroscopy knee,  meniscus repair Right        History     Social History   . Marital Status: Married     Spouse Name: N/A   . Number of Children: 0   . 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 Smoking status: Current Some Day Smoker -- 3 years     Types: Cigars   . Smokeless tobacco: Not on file      Comment: cigar twice a week or so   . Alcohol Use: No   . Drug Use: No   . Sexual Activity:     Partners: Female     Other Topics Concern   . Not on file     Social History Narrative    Lives with his wife.    Exercise: stairs, bicycle, weightlifting       Family History   Problem Relation Age of Onset   . Arthritis Mother    . Liver disease Father    . Alcohol abuse Father        No Known Allergies    Current Outpatient Prescriptions   Medication Sig Dispense Refill   . cyclobenzaprine (FLEXERIL) 10 MG  tablet Take 1 tablet (10 mg total) by mouth 3 (three) times daily as needed for Muscle spasms. 12 tablet 0   . ibuprofen (ADVIL,MOTRIN) 600 MG tablet Take 1 tablet (600 mg total) by mouth every 6 (six) hours as needed for Pain or Fever. 30 tablet 0     No current facility-administered medications for this visit.       Review of Systems   Constitutional: Negative for fever and chills.   Respiratory: Negative for chest tightness and shortness of breath.    Cardiovascular: Negative for chest pain and palpitations.   Gastrointestinal: Negative for nausea, vomiting, abdominal pain and diarrhea.   Genitourinary: Negative for dysuria, frequency and hematuria.   Neurological: Negative for dizziness, weakness, light-headedness and numbness.         Objective:    Physical Exam   Constitutional: He appears well-developed. No distress.   obese   HENT:   Moist oral mucous membranes   Cardiovascular: Normal rate and regular rhythm.  Exam reveals no gallop and no friction rub.    No murmur heard.  Pulmonary/Chest: Effort normal. No respiratory distress. He has no wheezes. He has no rales.   Abdominal:  Soft. He exhibits no mass. There is no tenderness.   Obese/protuberant abdomen   Musculoskeletal:   No tenderness on palpation of bony spine throughout.  No tenderness on palpation of paraspinal muscles in bilateral lumbar areas or on bilateral buttock areas   Neurological: He has normal strength.   Reflex Scores:       Patellar reflexes are 2+ on the right side and 2+ on the left side.  Bilateral straight leg raise tests are negative  Strip is normal.  Bilateral lower extremities.  Gait is normal   Skin: He is not diaphoretic.   Vitals reviewed.    BP 117/78 mmHg  Pulse 73  Temp(Src) 98.1 F (36.7 C) (Oral)  Ht 1.689 m (5' 6.5")  Wt 99.338 kg (219 lb)  BMI 34.82 kg/m2        Assessment:       1. Left-sided low back pain without sciatica            Plan:       Discussed with patient that we agree that his left lower back pain is likely muscular in nature due to his history and exam.    We did encourage him to take ibuprofen 600 mg 2 times a day with food.  We have also encouraged him to do routine back stretching exercises which were demonstrated in clinic today.  He will continue to use Flexeril every 8 hours as needed and is aware that this can cause drowsiness and should not drive or operate heavy machinery or return to work while he is taking the medication.    She will return to clinic if there is no improvement in his back pain over the next 3-5 days or sooner as needed.  At that time, consider imaging and physical therapy.  Procedures

## 2016-11-30 ENCOUNTER — Emergency Department: Payer: BLUE CROSS/BLUE SHIELD

## 2016-11-30 ENCOUNTER — Emergency Department
Admission: EM | Admit: 2016-11-30 | Discharge: 2016-11-30 | Disposition: A | Discharge status: Home / Self Care | Payer: BLUE CROSS/BLUE SHIELD | Attending: Emergency Medicine | Admitting: Emergency Medicine

## 2016-11-30 DIAGNOSIS — M7989 Other specified soft tissue disorders: Secondary | ICD-10-CM | POA: Insufficient documentation

## 2016-11-30 DIAGNOSIS — F17290 Nicotine dependence, other tobacco product, uncomplicated: Secondary | ICD-10-CM | POA: Insufficient documentation

## 2016-11-30 MED ORDER — AMOXICILLIN-POT CLAVULANATE 875-125 MG PO TABS
1.0000 | ORAL_TABLET | Freq: Two times a day (BID) | ORAL | 0 refills | Status: AC
Start: 2016-11-30 — End: 2016-12-07

## 2016-11-30 MED ORDER — AMOXICILLIN-POT CLAVULANATE 875-125 MG PO TABS
1.0000 | ORAL_TABLET | Freq: Once | ORAL | Status: AC
Start: 2016-11-30 — End: 2016-11-30
  Administered 2016-11-30: 1 via ORAL
  Filled 2016-11-30: qty 1

## 2016-11-30 NOTE — Discharge Instructions (Signed)
Return to ED immediately for fever, chills, sweats, worsening swelling of the hand, pus from the hand, numbness/tingling in the hand, nausea, fatigue, or vomiting.    Keep the hand clean and avoid getting dirt into the area of skin break.    Keep the hand elevated and take antibiotics as prescribed.     Follow up with Dr. Thedore Mins from hand surgery on Tuesday as discussed.

## 2016-11-30 NOTE — ED Provider Notes (Signed)
EMERGENCY DEPARTMENT HISTORY AND PHYSICAL EXAM     Physician/Midlevel provider first contact with patient: 11/30/16 5621         Date: 11/30/2016  Patient Name: William Day    History of Presenting Illness     Chief Complaint   Patient presents with   . Cellulitis       History Provided By: Patient    Chief Complaint: L hand blistering and swelling  Duration: 1 week ago  Timing: improving  Location: L dorsal hand    Quality: with edema   Severity: Moderate  Exacerbating factors: laser tattoo removal procedure at the same site 1 week ago  Alleviating factors: none  Associated Symptoms: L hand swelling, clear drainage from blisters,   Pertinent Negatives: L hand pain, fever, chills, purulent drainage, numbness, or other complaints    Additional History: William Day is a 46 y.o. male presenting to the ED with L hand blistering onset 1 week ago s/p laser tattoo removal procedure at the same site. States after procedure he noted inflammation, L hand swelling, and clear drainage. Hand swelling has improved. Drainage has mostly stopped. Drainage never purulent. The hand was much more swollen a few days ago; it has been improving spontaneously. Denies he ever had associated L hand pain or fever. No Hx of HIV or DM. His tetanus shot is up to date. He's had similar laser tattoo procedure on his neck and he had similar blistering. States it improved on it's own. No Abx use. No other complaints.    PCP: Standley Dakins, MD  SPECIALISTS:    No current facility-administered medications for this encounter.      Current Outpatient Prescriptions   Medication Sig Dispense Refill   . amoxicillin-clavulanate (AUGMENTIN) 875-125 MG per tablet Take 1 tablet by mouth 2 (two) times daily.for 7 days 14 tablet 0       Past History     Past Medical History:  Past Medical History:   Diagnosis Date   . No known health problems        Past Surgical History:  Past Surgical History:   Procedure Laterality Date   . ARTHROSCOPY  KNEE, MENISCUS REPAIR Right    . CHOLECYSTECTOMY         Family History:  Family History   Problem Relation Age of Onset   . Arthritis Mother    . Liver disease Father    . Alcohol abuse Father        Social History:  Social History   Substance Use Topics   . Smoking status: Current Some Day Smoker     Years: 3.00     Types: Cigars   . Smokeless tobacco: Never Used      Comment: cigar twice a week or so   . Alcohol use No       Allergies:  No Known Allergies    Review of Systems     Review of Systems   Constitutional: Negative for fatigue and fever.   HENT: Negative for rhinorrhea and sore throat.    Eyes: Negative for discharge, redness and visual disturbance.   Respiratory: Negative for cough and shortness of breath.    Cardiovascular: Negative for chest pain and leg swelling.   Gastrointestinal: Negative for abdominal pain and nausea.   Endocrine: Negative for polyuria.   Genitourinary: Negative for dysuria, flank pain, frequency and urgency.   Musculoskeletal: Negative for back pain, neck pain and neck stiffness.   Skin: Positive  for wound. Negative for rash.   Allergic/Immunologic: Negative for immunocompromised state.   Neurological: Negative for light-headedness and headaches.   Hematological: Does not bruise/bleed easily.   Psychiatric/Behavioral: Negative for suicidal ideas.     Physical Exam   BP 124/78   Pulse 79   Temp 97.7 F (36.5 C)   Resp 16   Ht 5\' 6"  (1.676 m)   Wt 108.9 kg   SpO2 97%   BMI 38.74 kg/m     Constitutional: Vital signs reviewed. Well appearing. No distress.  Head: Normocephalic, atraumatic  Eyes: Conjunctiva and sclera are normal.  No injection or discharge.  Ears, Nose, Throat:  Normal external examination of the nose and ears. No throat or oropharyngeal swelling or erythema. Midline uvula. Mucous membranes moist.  Neck: Normal range of motion. Supple, no meningeal signs. Trachea midline. No stridor. No JVD  Respiratory/Chest: Clear to auscultation. No respiratory distress.    Cardiovascular: Regular rate and rhythm. No murmurs.  Abdomen:  Bowel sounds intact. No rebound or guarding. Soft.  Non-tender.  Back: No CVA tenderness to percussion. No focal tenderness.  Upper Extremity:  (+) diffuse mild swelling to the L hand, most pronounced on the dorsal aspect; no erythema or focal tenderness to the L hand. There is approximately 3cm x 1.5cm keloid formation on the dorsum of the L hand where patient had laser tattoo removal; in the middle of this formation there is approximately 0.1 cm x 0.1 cm area of skin break with scant clear drainage, no pus. There is clear drainage No cyanosis. Bilateral radial pulses intact and equal.   Lower Extremity:  No edema. No cyanosis. Bilateral calves symmetrical and non-tender. Bilateral femoral, DP, PT pulses intact and equal.  Skin: Warm and dry. No rash.  Neuro: A&Ox3. Strength 5/5 and symmetrical in the bilateral upper extremities. Sensation to sharp touch intact and equal in the bilateral upper extremities.  Normal gait.   Psychiatric: Normal affect.  Normal insight.    Diagnostic Study Results     Labs -     Results     ** No results found for the last 24 hours. **          Radiologic Studies -   Radiology Results (24 Hour)     Procedure Component Value Units Date/Time    Hand Left PA Lateral and Oblique [[id number]] Collected:  11/30/16 0806    Order Status:  Completed Updated:  11/30/16 0811    Narrative:       CLINICAL INDICATION: L hand infection       COMPARISON: None available    FINDINGS:  3  views were obtained. There is no fracture or dislocation.  The bony architecture is normal.  No soft tissue calcification is noted  and no radiopaque foreign body is demonstrated.      Impression:         UNREMARKABLE STUDY       Laurena Slimmer, MD   11/30/2016 8:07 AM      .    Medical Decision Making   I am the first provider for this patient.    I reviewed the vital signs, available nursing notes, past medical history, past surgical history, family history  and social history.    Vital Signs-Reviewed the patient's vital signs.     Patient Vitals for the past 12 hrs:   BP Temp Pulse Resp   11/30/16 0632 124/78 97.7 F (36.5 C) 79 16  Pulse Oximetry Analysis - Normal 97% on RA    Old Medical Records: Old medical records. Nursing notes.     ED Course:     7:20 AM - Discussed treatment plan with pt. Pt is agreeable with plan.    7:49 AM - Discussed pt case with Dr. Thedore Mins, (Plastics/Hand), who states start on Augmentin and give RICE instructions. Will f/u with pt on Tuesday.    8:23 AM - Discussed results with pt and counseled on diagnosis, f/u plans, medication use, supportive care, and signs and symptoms when to return to ED.  Pt voices understanding and agreement with plan. All questions and concerns addressed.       Provider Notes: Pt well appearing with no SIRS criteria. No focal tenderness to the hand. No purulent drainage. Swelling has been improving on its own over past several days though the L hand is still somewhat swollen. Discussed with hand surgery on call as above. Started on augmentin and reviewed RICE instructions. He will f/u with hand surgery for recheck on Tuesday.      Diagnosis     Clinical Impression:   1. Swelling of left hand        Treatment Plan:   ED Disposition     ED Disposition Condition Date/Time Comment    Discharge  Sat Nov 30, 2016  8:23 AM Oretha Milch discharge to home/self care.    Condition at disposition: Stable            _______________________________      Attestations: This note is prepared by Georgina Quint, acting as scribe for Lynnea Ferrier, MD.    Lynnea Ferrier, MD - The scribe's documentation has been prepared under my direction and personally reviewed by me in its entirety.  I confirm that the note above accurately reflects all work, treatment, procedures, and medical decision making performed by me.    _______________________________     Maryella Shivers, MD  12/01/16 [phone]

## 2016-11-30 NOTE — ED Triage Notes (Signed)
Patient is a 46 y/o male presenting to the ED with swelling and blistering of left hand s/p tattoo removal within that area one week ago.

## 2016-12-03 ENCOUNTER — Telehealth (INDEPENDENT_AMBULATORY_CARE_PROVIDER_SITE_OTHER): Payer: Self-pay

## 2016-12-03 NOTE — Telephone Encounter (Signed)
Made attempt to contact patient on ph number provided on chart. Patient did not answer and no voicemail available to leave message.

## 2019-07-13 ENCOUNTER — Other Ambulatory Visit: Payer: Self-pay | Admitting: *Deleted

## 2019-07-13 DIAGNOSIS — Z20822 Contact with and (suspected) exposure to covid-19: Secondary | ICD-10-CM

## 2019-07-14 LAB — NOVEL CORONAVIRUS, NAA: SARS-CoV-2, NAA: NOT DETECTED

## 2019-07-15 ENCOUNTER — Telehealth: Payer: Self-pay | Admitting: General Practice

## 2019-07-15 NOTE — Telephone Encounter (Signed)
Patient informed of negative covid result. Patient verbalized understanding.   

## 2019-12-31 DIAGNOSIS — L853 Xerosis cutis: Secondary | ICD-10-CM | POA: Diagnosis not present

## 2019-12-31 DIAGNOSIS — L730 Acne keloid: Secondary | ICD-10-CM | POA: Diagnosis not present

## 2019-12-31 DIAGNOSIS — L738 Other specified follicular disorders: Secondary | ICD-10-CM | POA: Diagnosis not present

## 2020-04-03 DIAGNOSIS — L668 Other cicatricial alopecia: Secondary | ICD-10-CM | POA: Diagnosis not present

## 2020-04-03 DIAGNOSIS — L299 Pruritus, unspecified: Secondary | ICD-10-CM | POA: Diagnosis not present

## 2020-04-03 DIAGNOSIS — R21 Rash and other nonspecific skin eruption: Secondary | ICD-10-CM | POA: Diagnosis not present

## 2020-04-03 DIAGNOSIS — L730 Acne keloid: Secondary | ICD-10-CM | POA: Diagnosis not present

## 2020-04-13 DIAGNOSIS — Z13228 Encounter for screening for other metabolic disorders: Secondary | ICD-10-CM | POA: Diagnosis not present

## 2020-04-13 DIAGNOSIS — N529 Male erectile dysfunction, unspecified: Secondary | ICD-10-CM | POA: Diagnosis not present

## 2020-04-13 DIAGNOSIS — Z0000 Encounter for general adult medical examination without abnormal findings: Secondary | ICD-10-CM | POA: Diagnosis not present

## 2020-04-13 DIAGNOSIS — Z13220 Encounter for screening for lipoid disorders: Secondary | ICD-10-CM | POA: Diagnosis not present

## 2020-04-27 DIAGNOSIS — L720 Epidermal cyst: Secondary | ICD-10-CM | POA: Diagnosis not present

## 2020-04-27 DIAGNOSIS — D485 Neoplasm of uncertain behavior of skin: Secondary | ICD-10-CM | POA: Diagnosis not present

## 2020-05-11 DIAGNOSIS — Z7689 Persons encountering health services in other specified circumstances: Secondary | ICD-10-CM | POA: Diagnosis not present

## 2020-05-11 DIAGNOSIS — L730 Acne keloid: Secondary | ICD-10-CM | POA: Diagnosis not present

## 2020-05-11 DIAGNOSIS — L720 Epidermal cyst: Secondary | ICD-10-CM | POA: Diagnosis not present

## 2020-05-23 DIAGNOSIS — Z03818 Encounter for observation for suspected exposure to other biological agents ruled out: Secondary | ICD-10-CM | POA: Diagnosis not present

## 2020-05-30 ENCOUNTER — Ambulatory Visit: Payer: Self-pay

## 2020-09-07 DIAGNOSIS — H10023 Other mucopurulent conjunctivitis, bilateral: Secondary | ICD-10-CM | POA: Diagnosis not present

## 2020-09-14 DIAGNOSIS — H10023 Other mucopurulent conjunctivitis, bilateral: Secondary | ICD-10-CM | POA: Diagnosis not present

## 2020-09-20 DIAGNOSIS — H10213 Acute toxic conjunctivitis, bilateral: Secondary | ICD-10-CM | POA: Diagnosis not present

## 2020-09-20 DIAGNOSIS — H11433 Conjunctival hyperemia, bilateral: Secondary | ICD-10-CM | POA: Diagnosis not present

## 2020-09-25 DIAGNOSIS — N529 Male erectile dysfunction, unspecified: Secondary | ICD-10-CM | POA: Diagnosis not present

## 2020-09-25 DIAGNOSIS — R635 Abnormal weight gain: Secondary | ICD-10-CM | POA: Diagnosis not present

## 2020-09-25 DIAGNOSIS — F17210 Nicotine dependence, cigarettes, uncomplicated: Secondary | ICD-10-CM | POA: Diagnosis not present

## 2020-10-17 DIAGNOSIS — Z1211 Encounter for screening for malignant neoplasm of colon: Secondary | ICD-10-CM | POA: Diagnosis not present

## 2020-11-20 DIAGNOSIS — F17210 Nicotine dependence, cigarettes, uncomplicated: Secondary | ICD-10-CM | POA: Diagnosis not present

## 2020-11-20 DIAGNOSIS — R1013 Epigastric pain: Secondary | ICD-10-CM | POA: Diagnosis not present

## 2020-11-21 DIAGNOSIS — R109 Unspecified abdominal pain: Secondary | ICD-10-CM | POA: Diagnosis not present

## 2020-11-21 DIAGNOSIS — R1013 Epigastric pain: Secondary | ICD-10-CM | POA: Diagnosis not present

## 2020-11-29 DIAGNOSIS — Z1211 Encounter for screening for malignant neoplasm of colon: Secondary | ICD-10-CM | POA: Diagnosis not present

## 2020-11-29 DIAGNOSIS — K635 Polyp of colon: Secondary | ICD-10-CM | POA: Diagnosis not present

## 2020-11-29 DIAGNOSIS — D127 Benign neoplasm of rectosigmoid junction: Secondary | ICD-10-CM | POA: Diagnosis not present

## 2020-12-14 DIAGNOSIS — N529 Male erectile dysfunction, unspecified: Secondary | ICD-10-CM | POA: Diagnosis not present

## 2020-12-14 DIAGNOSIS — R1013 Epigastric pain: Secondary | ICD-10-CM | POA: Diagnosis not present

## 2020-12-14 DIAGNOSIS — F17210 Nicotine dependence, cigarettes, uncomplicated: Secondary | ICD-10-CM | POA: Diagnosis not present

## 2021-01-30 ENCOUNTER — Encounter: Payer: Self-pay | Admitting: Emergency Medicine

## 2021-01-30 ENCOUNTER — Emergency Department
Admission: EM | Admit: 2021-01-30 | Discharge: 2021-01-30 | Disposition: A | Discharge status: Home / Self Care | Payer: BC Managed Care – PPO | Attending: Emergency Medicine | Admitting: Emergency Medicine

## 2021-01-30 ENCOUNTER — Other Ambulatory Visit: Payer: Self-pay

## 2021-01-30 DIAGNOSIS — S34109A Unspecified injury to unspecified level of lumbar spinal cord, initial encounter: Secondary | ICD-10-CM | POA: Diagnosis not present

## 2021-01-30 DIAGNOSIS — X58XXXA Exposure to other specified factors, initial encounter: Secondary | ICD-10-CM | POA: Diagnosis not present

## 2021-01-30 DIAGNOSIS — S39012A Strain of muscle, fascia and tendon of lower back, initial encounter: Secondary | ICD-10-CM | POA: Diagnosis not present

## 2021-01-30 MED ORDER — CYCLOBENZAPRINE HCL 5 MG PO TABS: 5.0000 mg | ORAL_TABLET | Freq: Three times a day (TID) | ORAL | 0 refills | Status: AC | PRN

## 2021-01-30 NOTE — Discharge Instructions (Addendum)
You may alternate Tylenol 1000 mg every 6 hours as needed for pain, fever and Ibuprofen 800 mg every 8 hours as needed for pain, fever.  Please take Ibuprofen with food.  Do not take more than 4000 mg of Tylenol (acetaminophen) in a 24 hour period.  You may alternate heat and ice.  I recommend no strenuous activity including exercise, CrossFit at this time.  Do not lift anything over 10 pounds until symptoms have improved.

## 2021-01-30 NOTE — ED Provider Notes (Signed)
[facility] Emergency Department Provider Note  ____________________________________________   Event Date/Time   First MD Initiated Contact with Patient 01/30/21 0406     (approximate)  I have reviewed the triage vital signs and the nursing notes.   HISTORY  Chief Complaint Back Pain    HPI Stanley Lopez is a 50 y.o. male who presents to the emergency department complaints of lower back pain that feels like a strain since Friday the 20th.  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  Denies any falls or direct injuries.  He states symptoms worse after sitting for a long period of time and trying to stand up.  He has tried ibuprofen and heating pad with some relief.  Denies numbness, tingling, weakness, bowel or bladder incontinence, urinary retention.  No history of back surgeries, epidural injections.  States he has taken Flexeril before with relief.  Patient works as a [employer]arry manager.         History reviewed. No pertinent past medical history.  There are no problems to display for this patient.   Past Surgical History:  Procedure Laterality Date  . CHOLECYSTECTOMY    . KNEE SURGERY Right   . WRIST SURGERY Left     Prior to Admission medications   Medication Sig Start Date End Date Taking? Authorizing Provider  cyclobenzaprine (FLEXERIL) 5 MG tablet Take 1-2 tablets (5-10 mg total) by mouth 3 (three) times daily as needed for muscle spasms. 01/30/21  Yes Yailene Badia, Layla Maw, DO    Allergies Patient has no known allergies.  No family history on file.  Social History Social History   Tobacco Use  . Smoking status: Never Smoker  . Smokeless tobacco: Never Used  Vaping Use  . Vaping Use: Never used    Review of Systems Constitutional: No fever. Eyes: No visual changes. ENT: No sore throat. Cardiovascular: Denies chest pain. Respiratory: Denies shortness of breath. Gastrointestinal: No nausea, vomiting,  diarrhea. Genitourinary: Negative for dysuria. Musculoskeletal: Negative for back pain. Skin: Negative for rash. Neurological: Negative for focal weakness or numbness.  ____________________________________________   PHYSICAL EXAM:  VITAL SIGNS: ED Triage Vitals  Enc Vitals Group     BP 01/30/21 0357 (!) 142/72     Pulse Rate 01/30/21 0357 66     Resp 01/30/21 0357 18     Temp 01/30/21 0357 97.9 F (36.6 C)     Temp Source 01/30/21 0357 Oral     SpO2 01/30/21 0357 97 %     Weight 01/30/21 0358 215 lb (97.5 kg)     Height 01/30/21 0358 5\' 6"  (1.676 m)     Head Circumference --      Peak Flow --      Pain Score 01/30/21 0357 1     Pain Loc --      Pain Edu? --      Excl. in GC? --    CONSTITUTIONAL: Alert and responds appropriately to questions. Well-appearing; well-nourished HEAD: Normocephalic, atraumatic EYES: Conjunctivae clear, pupils appear equal ENT: normal nose; moist mucous membranes NECK: Normal range of motion CARD: Regular rate and rhythm RESP: Normal chest excursion without splinting or tachypnea; no hypoxia or respiratory distress, speaking full sentences ABD/GI: non-distended BACK: Patient has some tenderness over the lower lumbar spine without step-off or deformity.  No redness, warmth, ecchymosis or soft tissue swelling noted. EXT: Normal ROM in all joints, no major deformities noted SKIN: Normal color for age and race, no rashes on exposed skin  NEURO: Moves all extremities equally, normal speech, no facial asymmetry noted, strength 5/5 in bilateral lower extremities, 2+ deep tendon reflexes in bilateral lower extremities, normal gait, no saddle anesthesia, normal sensation PSYCH: The patient's mood and manner are appropriate. Grooming and personal hygiene are appropriate.  ____________________________________________   LABS (all labs ordered are listed, but only abnormal results are displayed)  Labs Reviewed - No data to  display ____________________________________________  EKG   ____________________________________________  RADIOLOGY I, Ahmadou Bolz, personally viewed and evaluated these images (plain radiographs) as part of my medical decision making, as well as reviewing the written report by the radiologist.  ED MD interpretation:    Official radiology report(s): No results found.  ____________________________________________   PROCEDURES  Procedure(s) performed (including Critical Care):  Procedures    ____________________________________________   INITIAL IMPRESSION / ASSESSMENT AND PLAN / ED COURSE  As part of my medical decision making, I reviewed the following data within the electronic MEDICAL RECORD NUMBER Nursing notes reviewed and incorporated, Old chart reviewed and Notes from prior ED visits         Patient here with lumbar strain.  Low suspicion for cauda equina, spinal stenosis, epidural abscess or hematoma, discitis or osteomyelitis, fracture, transverse myelitis.  Recommended continued use of Tylenol, ibuprofen over-the-counter and alternating heat and ice.  Recommended no heavy lifting or exertion for the next few days.  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  Will provide with prescription of Flexeril for symptomatic relief.  Discussed return precautions.  Patient verbalized understanding.  He has a PCP for follow-up.  At this time, I do not feel there is any life-threatening condition present. I have reviewed, interpreted and discussed all results (EKG, imaging, lab, urine as appropriate) and exam findings with patient/family. I have reviewed nursing notes and appropriate previous records.  I feel the patient is safe to be discharged home without further emergent workup and can continue workup as an outpatient as needed. Discussed usual and customary return precautions. Patient/family verbalize understanding and are comfortable with this plan.   Outpatient follow-up has been provided as needed. All questions have been answered.   ____________________________________________   FINAL CLINICAL IMPRESSION(S) / ED DIAGNOSES  Final diagnoses:  Strain of lumbar region, initial encounter     ED Discharge Orders         Ordered    cyclobenzaprine (FLEXERIL) 5 MG tablet  3 times daily PRN        01/30/21 0504          *Please note:  Stanley Lopez was evaluated in Emergency Department on 01/30/2021 for the symptoms described in the history of present illness. He was evaluated in the context of the global COVID-19 pandemic, which necessitated consideration that the patient might be at risk for infection with the SARS-CoV-2 virus that causes COVID-19. Institutional protocols and algorithms that pertain to the evaluation of patients at risk for COVID-19 are in a state of rapid change based on information released by regulatory bodies including the CDC and federal and state organizations. These policies and algorithms were followed during the patient's care in the ED.  Some ED evaluations and interventions may be delayed as a result of limited staffing during and the pandemic.*   Note:  This document was prepared using Dragon voice recognition software and may include unintentional dictation errors.   Gearlene Godsil, Layla Maw, DO 01/30/21 [phone]

## 2021-01-30 NOTE — ED Triage Notes (Signed)
Patient ambulatory to triage with steady gait, without difficulty or distress noted; pt reports lower back pain that began on Friday after crossfit

## 2021-02-06 DIAGNOSIS — M5410 Radiculopathy, site unspecified: Secondary | ICD-10-CM | POA: Diagnosis not present

## 2021-02-06 DIAGNOSIS — M5459 Other low back pain: Secondary | ICD-10-CM | POA: Diagnosis not present

## 2021-02-08 DIAGNOSIS — M5386 Other specified dorsopathies, lumbar region: Secondary | ICD-10-CM | POA: Diagnosis not present

## 2021-02-08 DIAGNOSIS — M5137 Other intervertebral disc degeneration, lumbosacral region: Secondary | ICD-10-CM | POA: Diagnosis not present

## 2021-02-08 DIAGNOSIS — M5442 Lumbago with sciatica, left side: Secondary | ICD-10-CM | POA: Diagnosis not present

## 2021-02-08 DIAGNOSIS — M9903 Segmental and somatic dysfunction of lumbar region: Secondary | ICD-10-CM | POA: Diagnosis not present

## 2021-02-09 DIAGNOSIS — M5442 Lumbago with sciatica, left side: Secondary | ICD-10-CM | POA: Diagnosis not present

## 2021-02-09 DIAGNOSIS — M9903 Segmental and somatic dysfunction of lumbar region: Secondary | ICD-10-CM | POA: Diagnosis not present

## 2021-02-09 DIAGNOSIS — M5386 Other specified dorsopathies, lumbar region: Secondary | ICD-10-CM | POA: Diagnosis not present

## 2021-02-09 DIAGNOSIS — M5137 Other intervertebral disc degeneration, lumbosacral region: Secondary | ICD-10-CM | POA: Diagnosis not present

## 2021-02-12 DIAGNOSIS — M5386 Other specified dorsopathies, lumbar region: Secondary | ICD-10-CM | POA: Diagnosis not present

## 2021-02-12 DIAGNOSIS — M9903 Segmental and somatic dysfunction of lumbar region: Secondary | ICD-10-CM | POA: Diagnosis not present

## 2021-02-12 DIAGNOSIS — M5442 Lumbago with sciatica, left side: Secondary | ICD-10-CM | POA: Diagnosis not present

## 2021-02-12 DIAGNOSIS — M5137 Other intervertebral disc degeneration, lumbosacral region: Secondary | ICD-10-CM | POA: Diagnosis not present

## 2021-02-16 DIAGNOSIS — M5386 Other specified dorsopathies, lumbar region: Secondary | ICD-10-CM | POA: Diagnosis not present

## 2021-02-16 DIAGNOSIS — M5137 Other intervertebral disc degeneration, lumbosacral region: Secondary | ICD-10-CM | POA: Diagnosis not present

## 2021-02-16 DIAGNOSIS — M5442 Lumbago with sciatica, left side: Secondary | ICD-10-CM | POA: Diagnosis not present

## 2021-02-16 DIAGNOSIS — M9903 Segmental and somatic dysfunction of lumbar region: Secondary | ICD-10-CM | POA: Diagnosis not present

## 2021-05-29 DIAGNOSIS — Z86010 Personal history of colonic polyps: Secondary | ICD-10-CM | POA: Diagnosis not present

## 2021-05-29 DIAGNOSIS — R194 Change in bowel habit: Secondary | ICD-10-CM | POA: Diagnosis not present

## 2021-05-29 DIAGNOSIS — R140 Abdominal distension (gaseous): Secondary | ICD-10-CM | POA: Diagnosis not present

## 2021-05-29 DIAGNOSIS — R197 Diarrhea, unspecified: Secondary | ICD-10-CM | POA: Diagnosis not present

## 2021-06-12 DIAGNOSIS — B9681 Helicobacter pylori [H. pylori] as the cause of diseases classified elsewhere: Secondary | ICD-10-CM | POA: Diagnosis not present

## 2021-06-26 ENCOUNTER — Other Ambulatory Visit: Payer: Self-pay | Admitting: Family Medicine

## 2021-06-26 DIAGNOSIS — F17210 Nicotine dependence, cigarettes, uncomplicated: Secondary | ICD-10-CM | POA: Diagnosis not present

## 2021-06-26 DIAGNOSIS — R110 Nausea: Secondary | ICD-10-CM

## 2021-06-26 DIAGNOSIS — R1013 Epigastric pain: Secondary | ICD-10-CM | POA: Diagnosis not present

## 2021-06-26 DIAGNOSIS — K635 Polyp of colon: Secondary | ICD-10-CM

## 2021-06-26 DIAGNOSIS — R635 Abnormal weight gain: Secondary | ICD-10-CM | POA: Diagnosis not present

## 2021-06-26 DIAGNOSIS — M5459 Other low back pain: Secondary | ICD-10-CM | POA: Diagnosis not present

## 2021-06-26 DIAGNOSIS — R109 Unspecified abdominal pain: Secondary | ICD-10-CM

## 2021-07-06 ENCOUNTER — Other Ambulatory Visit: Payer: Self-pay | Admitting: Family Medicine

## 2021-07-06 DIAGNOSIS — R109 Unspecified abdominal pain: Secondary | ICD-10-CM

## 2021-07-06 DIAGNOSIS — K635 Polyp of colon: Secondary | ICD-10-CM

## 2021-07-06 DIAGNOSIS — R110 Nausea: Secondary | ICD-10-CM

## 2021-07-10 ENCOUNTER — Ambulatory Visit
Admission: RE | Admit: 2021-07-10 | Discharge: 2021-07-10 | Disposition: A | Discharge status: Home / Self Care | Payer: BC Managed Care – PPO | Source: Ambulatory Visit | Attending: Family Medicine | Admitting: Family Medicine

## 2021-07-10 ENCOUNTER — Other Ambulatory Visit: Payer: Self-pay

## 2021-07-10 DIAGNOSIS — K429 Umbilical hernia without obstruction or gangrene: Secondary | ICD-10-CM | POA: Diagnosis not present

## 2021-07-10 DIAGNOSIS — K635 Polyp of colon: Secondary | ICD-10-CM

## 2021-07-10 DIAGNOSIS — R109 Unspecified abdominal pain: Secondary | ICD-10-CM

## 2021-07-10 DIAGNOSIS — R110 Nausea: Secondary | ICD-10-CM

## 2021-07-10 DIAGNOSIS — N281 Cyst of kidney, acquired: Secondary | ICD-10-CM | POA: Diagnosis not present

## 2021-07-10 DIAGNOSIS — Z9049 Acquired absence of other specified parts of digestive tract: Secondary | ICD-10-CM | POA: Diagnosis not present

## 2021-07-10 MED ORDER — IOPAMIDOL (ISOVUE-300) INJECTION 61%
100.0000 mL | Freq: Once | INTRAVENOUS | Status: AC | PRN
  Administered 2021-07-10: 100 mL via INTRAVENOUS

## 2021-07-16 DIAGNOSIS — Z23 Encounter for immunization: Secondary | ICD-10-CM | POA: Diagnosis not present

## 2021-07-16 DIAGNOSIS — Z8619 Personal history of other infectious and parasitic diseases: Secondary | ICD-10-CM | POA: Diagnosis not present

## 2021-07-16 DIAGNOSIS — R109 Unspecified abdominal pain: Secondary | ICD-10-CM | POA: Diagnosis not present

## 2021-07-21 DIAGNOSIS — L0231 Cutaneous abscess of buttock: Secondary | ICD-10-CM | POA: Diagnosis not present

## 2021-07-27 DIAGNOSIS — L0231 Cutaneous abscess of buttock: Secondary | ICD-10-CM | POA: Diagnosis not present

## 2021-07-27 DIAGNOSIS — L231 Allergic contact dermatitis due to adhesives: Secondary | ICD-10-CM | POA: Diagnosis not present

## 2021-08-07 DIAGNOSIS — R109 Unspecified abdominal pain: Secondary | ICD-10-CM | POA: Diagnosis not present

## 2021-08-07 DIAGNOSIS — Z13220 Encounter for screening for lipoid disorders: Secondary | ICD-10-CM | POA: Diagnosis not present

## 2021-08-07 DIAGNOSIS — E663 Overweight: Secondary | ICD-10-CM | POA: Diagnosis not present

## 2021-08-07 DIAGNOSIS — Z131 Encounter for screening for diabetes mellitus: Secondary | ICD-10-CM | POA: Diagnosis not present

## 2021-10-30 DIAGNOSIS — R7303 Prediabetes: Secondary | ICD-10-CM | POA: Diagnosis not present

## 2021-10-30 DIAGNOSIS — Z23 Encounter for immunization: Secondary | ICD-10-CM | POA: Diagnosis not present

## 2021-10-30 DIAGNOSIS — Z13220 Encounter for screening for lipoid disorders: Secondary | ICD-10-CM | POA: Diagnosis not present

## 2021-10-30 DIAGNOSIS — Z0000 Encounter for general adult medical examination without abnormal findings: Secondary | ICD-10-CM | POA: Diagnosis not present

## 2021-10-30 DIAGNOSIS — Z125 Encounter for screening for malignant neoplasm of prostate: Secondary | ICD-10-CM | POA: Diagnosis not present

## 2021-12-12 DIAGNOSIS — L739 Follicular disorder, unspecified: Secondary | ICD-10-CM | POA: Diagnosis not present

## 2022-01-12 IMAGING — CT CT ABD-PELV W/ CM
1 of 3 series · 15 of 31 positions shown, 19 images · IV contrast (iopamidol)
Comparison: None.

CLINICAL DATA: Acute abdominal pain.

EXAM:
CT ABDOMEN AND PELVIS WITH CONTRAST
TECHNIQUE: Multidetector CT imaging of the abdomen and pelvis was performed
using the standard protocol following bolus administration of
intravenous contrast.
CONTRAST:  100mL 4CO5WP-TII IOPAMIDOL (4CO5WP-TII) INJECTION 61%

[Series 2: ap w/cm · axial · 0.98mm/px · z∈[-466,-41]mm · 15 of 97 slices shown, 19 images]
[im 6/97  mediastinal]
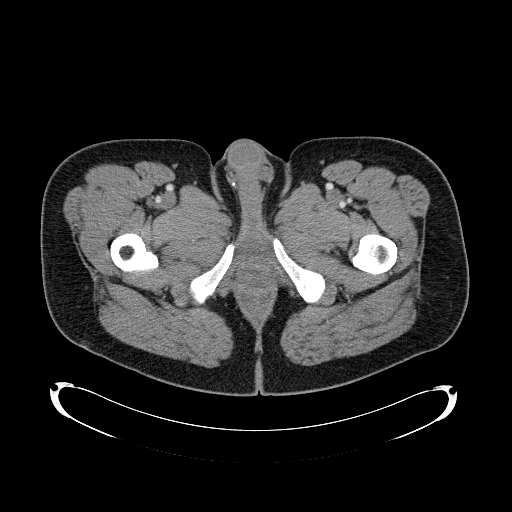
[im 6/97  lung]
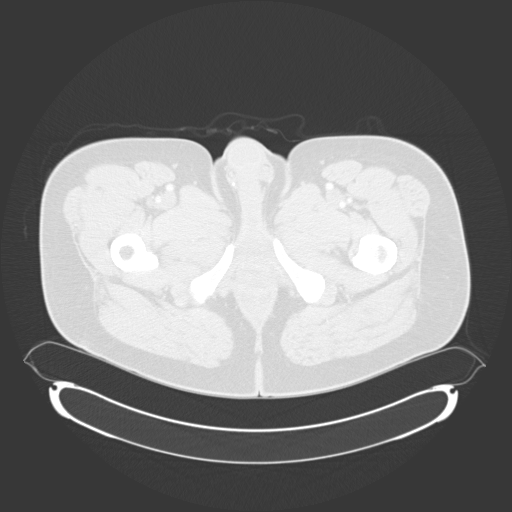
[im 12/97  lung]
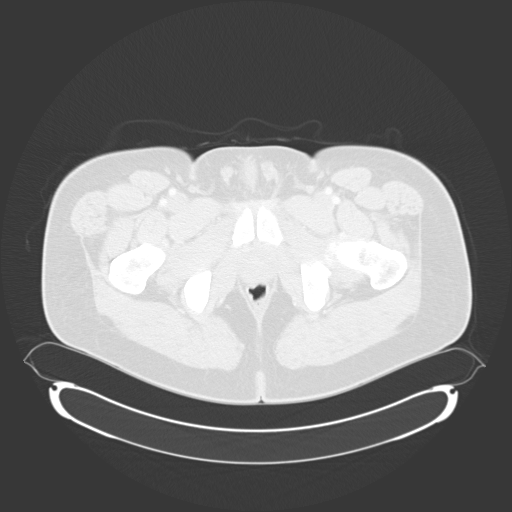
[im 23/97  lung]
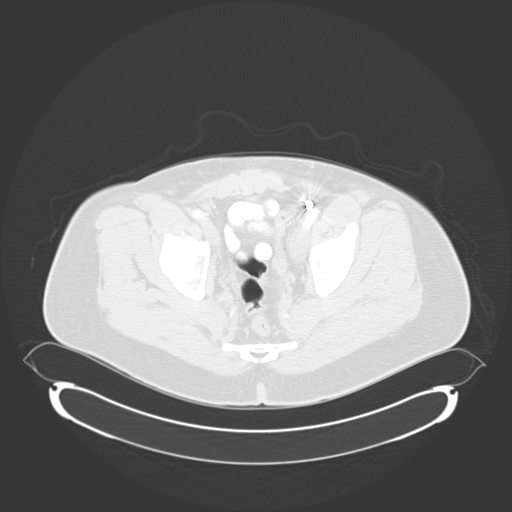
[im 29/97  lung]
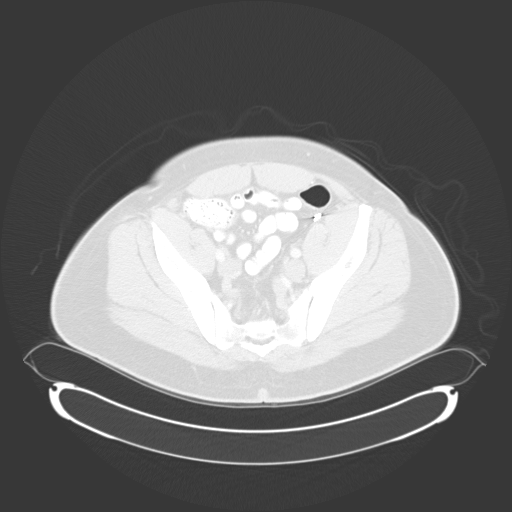
[im 33/97  mediastinal]
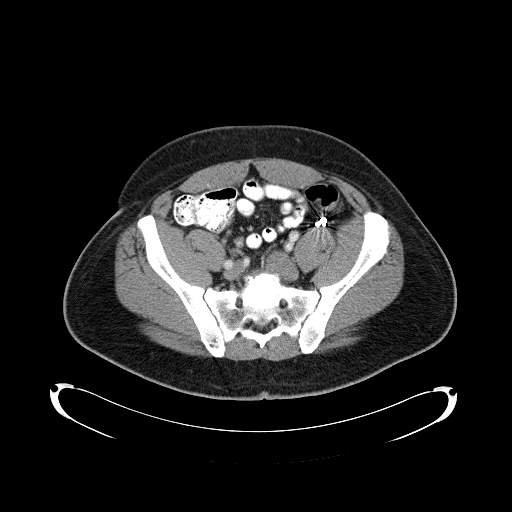
[im 33/97  lung]
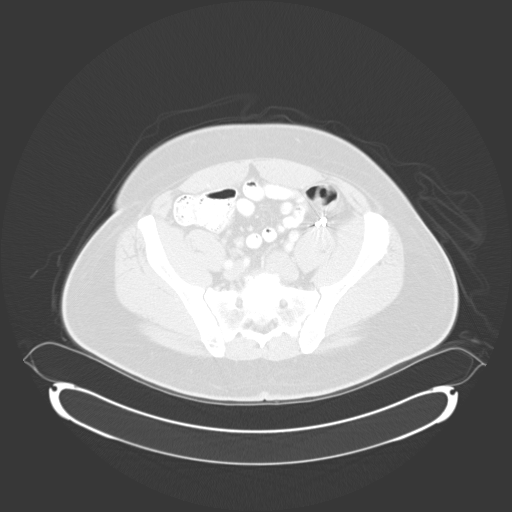
[im 34/97  lung]
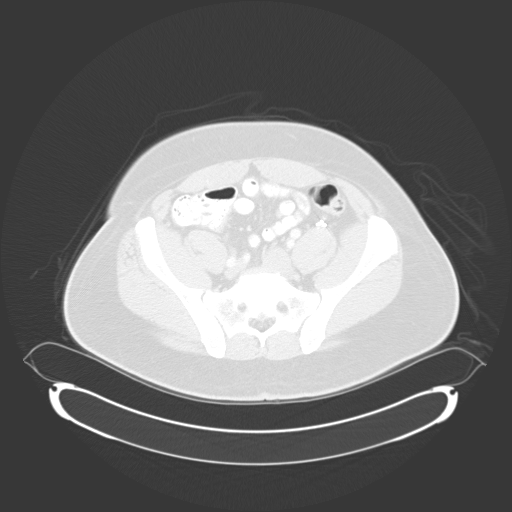
[im 46/97  lung]
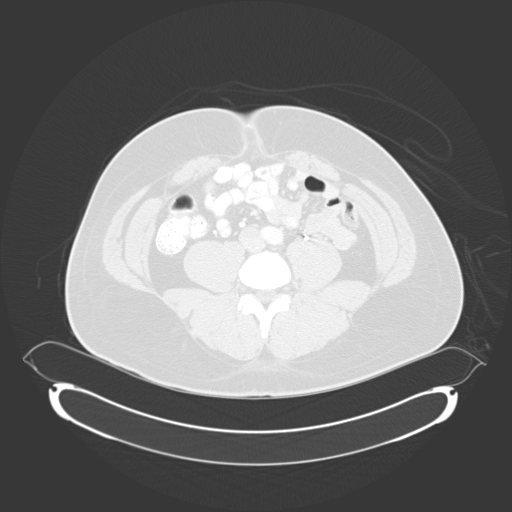
[im 47/97  lung]
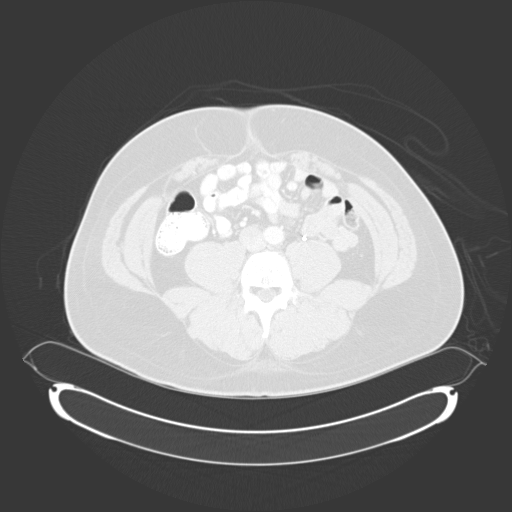
[im 51/97  mediastinal]
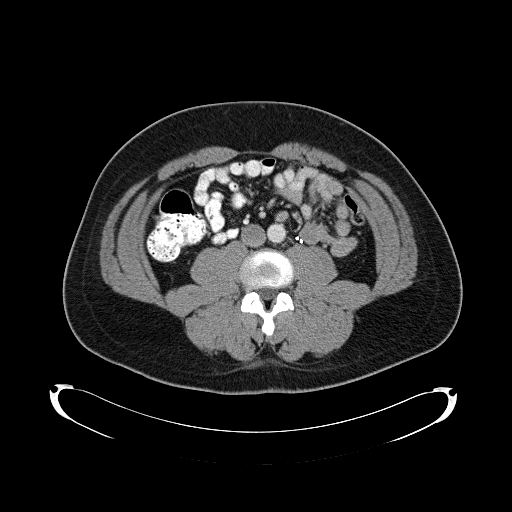
[im 51/97  lung]
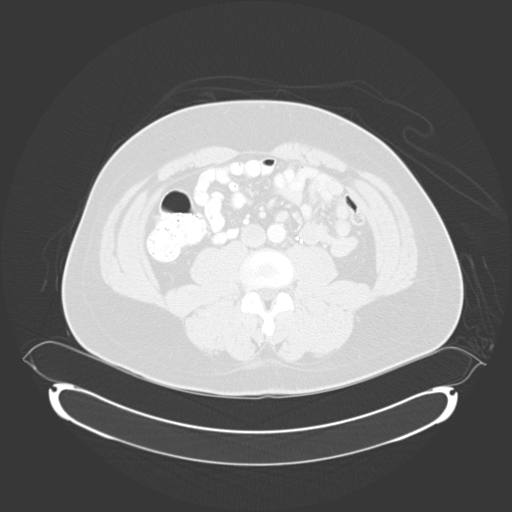
[im 63/97  lung]
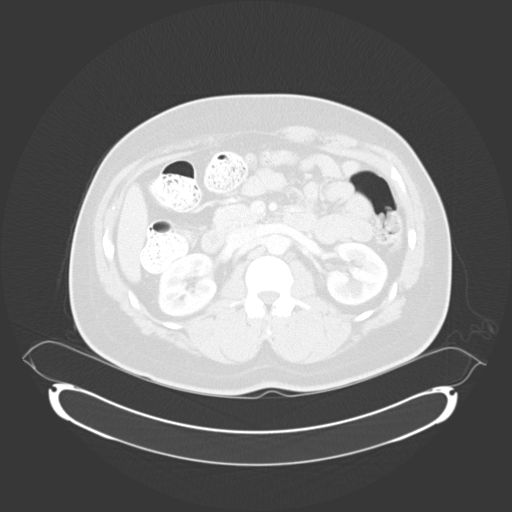
[im 65/97  lung]
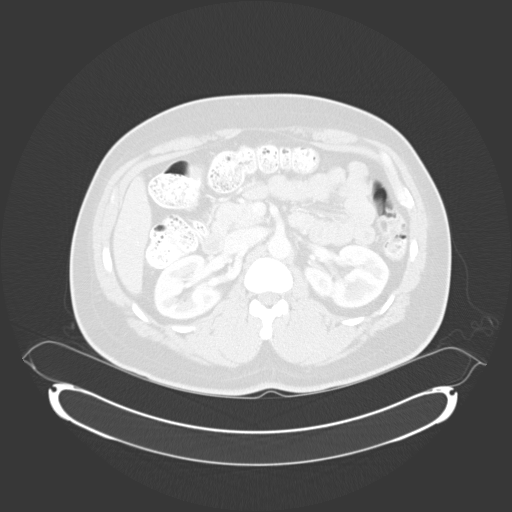
[im 68/97  lung]
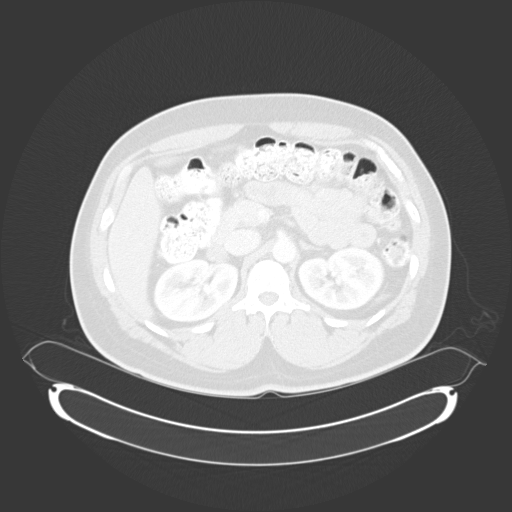
[im 74/97  mediastinal]
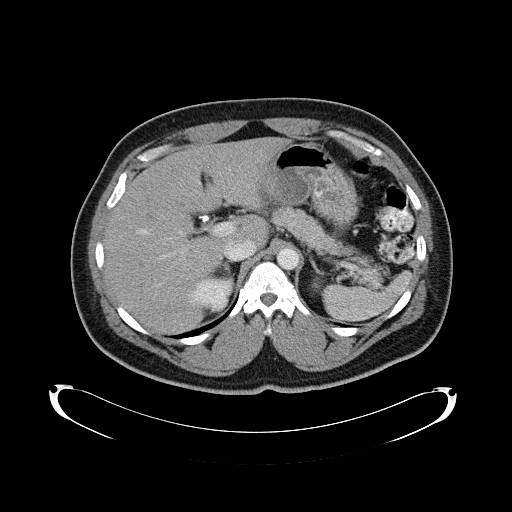
[im 74/97  lung]
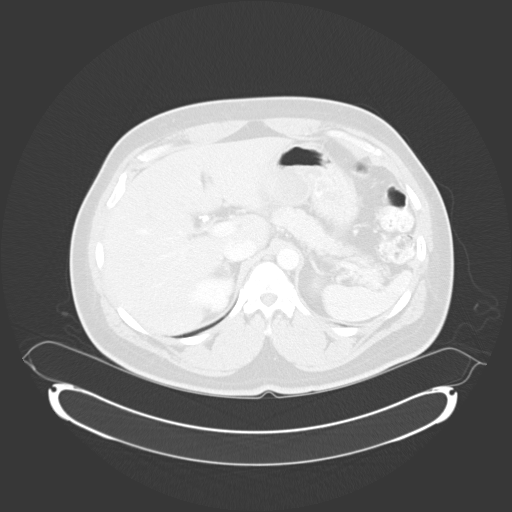
[im 85/97  lung]
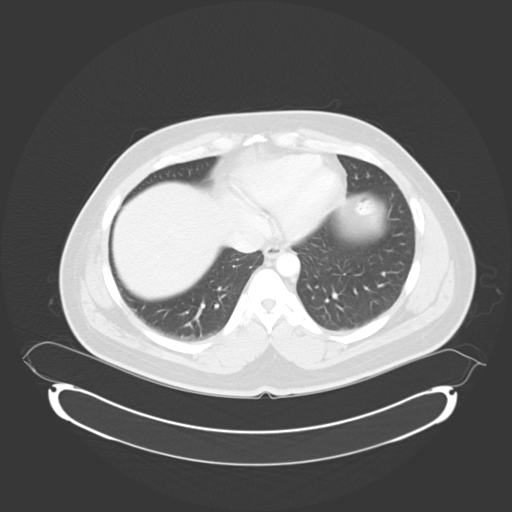
[im 91/97  lung]
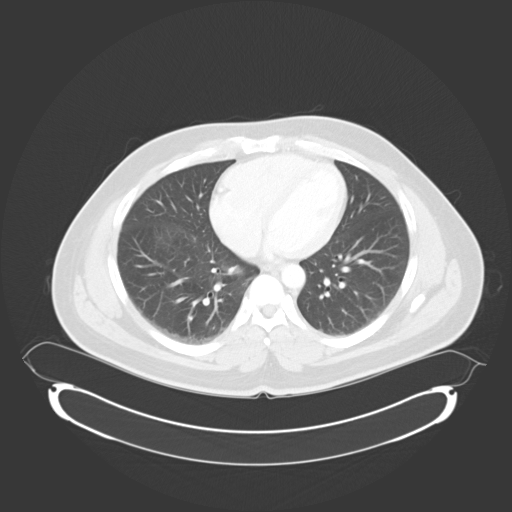

[15 of 31 positions shown; findings below may reference images not displayed]

FINDINGS: Lower chest: No acute abnormality.

Hepatobiliary: No focal liver abnormality is seen. Status post
cholecystectomy. No biliary dilatation.

Pancreas: Unremarkable. No pancreatic ductal dilatation or
surrounding inflammatory changes.

Spleen: Normal in size without focal abnormality.

Adrenals/Urinary Tract: Adrenal glands appear normal. Left renal
cyst is noted. No hydronephrosis or renal obstruction is noted. No
renal or ureteral calculi are noted. Urinary bladder is
unremarkable.

Stomach/Bowel: Stomach is within normal limits. Appendix appears
normal. No evidence of bowel wall thickening, distention, or
inflammatory changes.

Vascular/Lymphatic: No significant vascular findings are present. No
enlarged abdominal or pelvic lymph nodes.

Reproductive: Prostate is unremarkable.

Other: Status post coil embolization of what appears to be the left
gonadal vein. Small fat containing periumbilical hernia is noted. No
ascites is noted.

Musculoskeletal: No acute or significant osseous findings.
IMPRESSION: No acute abnormality seen in the abdomen or pelvis.

## 2022-03-14 DIAGNOSIS — H1013 Acute atopic conjunctivitis, bilateral: Secondary | ICD-10-CM | POA: Diagnosis not present

## 2022-04-01 DIAGNOSIS — L309 Dermatitis, unspecified: Secondary | ICD-10-CM | POA: Diagnosis not present

## 2022-04-01 DIAGNOSIS — L739 Follicular disorder, unspecified: Secondary | ICD-10-CM | POA: Diagnosis not present

## 2022-04-30 DIAGNOSIS — L729 Follicular cyst of the skin and subcutaneous tissue, unspecified: Secondary | ICD-10-CM | POA: Diagnosis not present

## 2022-06-26 DIAGNOSIS — M79672 Pain in left foot: Secondary | ICD-10-CM | POA: Diagnosis not present

## 2022-06-26 DIAGNOSIS — M722 Plantar fascial fibromatosis: Secondary | ICD-10-CM | POA: Diagnosis not present

## 2022-09-10 DIAGNOSIS — L730 Acne keloid: Secondary | ICD-10-CM | POA: Diagnosis not present

## 2022-09-10 DIAGNOSIS — L720 Epidermal cyst: Secondary | ICD-10-CM | POA: Diagnosis not present

## 2022-09-26 DIAGNOSIS — L720 Epidermal cyst: Secondary | ICD-10-CM | POA: Diagnosis not present

## 2022-12-26 DIAGNOSIS — M778 Other enthesopathies, not elsewhere classified: Secondary | ICD-10-CM | POA: Diagnosis not present

## 2023-03-10 DIAGNOSIS — M546 Pain in thoracic spine: Secondary | ICD-10-CM | POA: Diagnosis not present

## 2023-03-10 DIAGNOSIS — M5451 Vertebrogenic low back pain: Secondary | ICD-10-CM | POA: Diagnosis not present

## 2023-03-10 DIAGNOSIS — M9902 Segmental and somatic dysfunction of thoracic region: Secondary | ICD-10-CM | POA: Diagnosis not present

## 2023-03-10 DIAGNOSIS — M9903 Segmental and somatic dysfunction of lumbar region: Secondary | ICD-10-CM | POA: Diagnosis not present

## 2023-03-11 DIAGNOSIS — M9902 Segmental and somatic dysfunction of thoracic region: Secondary | ICD-10-CM | POA: Diagnosis not present

## 2023-03-11 DIAGNOSIS — M9903 Segmental and somatic dysfunction of lumbar region: Secondary | ICD-10-CM | POA: Diagnosis not present

## 2023-03-11 DIAGNOSIS — M546 Pain in thoracic spine: Secondary | ICD-10-CM | POA: Diagnosis not present

## 2023-03-11 DIAGNOSIS — M5451 Vertebrogenic low back pain: Secondary | ICD-10-CM | POA: Diagnosis not present

## 2023-03-17 ENCOUNTER — Emergency Department
Admission: EM | Admit: 2023-03-17 | Discharge: 2023-03-17 | Disposition: A | Discharge status: Home / Self Care | Payer: BC Managed Care – PPO | Attending: Emergency Medicine | Admitting: Emergency Medicine

## 2023-03-17 ENCOUNTER — Other Ambulatory Visit: Payer: Self-pay

## 2023-03-17 ENCOUNTER — Encounter: Payer: Self-pay | Admitting: Emergency Medicine

## 2023-03-17 DIAGNOSIS — N492 Inflammatory disorders of scrotum: Secondary | ICD-10-CM

## 2023-03-17 DIAGNOSIS — R1030 Lower abdominal pain, unspecified: Secondary | ICD-10-CM | POA: Diagnosis not present

## 2023-03-17 MED ORDER — LIDOCAINE HCL (PF) 1 % IJ SOLN
10.0000 mL | Freq: Once | INTRAMUSCULAR | Status: AC
  Administered 2023-03-17: 10 mL via INTRADERMAL
  Filled 2023-03-17: qty 10

## 2023-03-17 MED ORDER — SULFAMETHOXAZOLE-TRIMETHOPRIM 800-160 MG PO TABS: 1.0000 | ORAL_TABLET | Freq: Two times a day (BID) | ORAL | 0 refills | Status: AC

## 2023-03-17 NOTE — ED Provider Notes (Signed)
[facility] Provider Note    Event Date/Time   First MD Initiated Contact with Patient 03/17/23 1339     (approximate)   History   Chief Complaint Groin Pain   HPI  Stanley Lopez is a 52 y.o. male with no significant past medical history who presents to the ED complaining of groin pain.  Patient reports that he has been dealing with increasing pain and swelling on the underside of his scrotum for about the past week.  He states it is painful for him to walk or sit in one position and swelling has been getting worse.  He has not noticed any drainage from the area.  He denies any difficulty urinating or penile discharge.     Physical Exam   Triage Vital Signs: ED Triage Vitals [03/17/23 1330]  Enc Vitals Group     BP 134/85     Pulse Rate 70     Resp 18     Temp 98.4 F (36.9 C)     Temp src      SpO2 100 %     Weight      Height      Head Circumference      Peak Flow      Pain Score 10     Pain Loc      Pain Edu?      Excl. in GC?     Most recent vital signs: Vitals:   03/17/23 1330  BP: 134/85  Pulse: 70  Resp: 18  Temp: 98.4 F (36.9 C)  SpO2: 100%    Constitutional: Alert and oriented. Eyes: Conjunctivae are normal. Head: Atraumatic. Nose: No congestion/rhinnorhea. Mouth/Throat: Mucous membranes are moist.  Cardiovascular: Normal rate, regular rhythm. Grossly normal heart sounds.  2+ radial pulses bilaterally. Respiratory: Normal respiratory effort.  No retractions. Lungs CTAB. Gastrointestinal: Soft and nontender. No distention. Genitourinary: Induration and fluctuance noted over posterior scrotum with overlying tenderness. Musculoskeletal: No lower extremity tenderness nor edema.  Neurologic:  Normal speech and language. No gross focal neurologic deficits are appreciated.    ED Results / Procedures / Treatments   Labs (all labs ordered are listed, but only abnormal results are displayed) Labs Reviewed - No data to  display   PROCEDURES:  Critical Care performed: No  ..Incision and Drainage  Date/Time: 03/17/2023 3:31 PM  Performed by: Chesley Noon, MD Authorized by: Chesley Noon, MD   Consent:    Consent obtained:  Verbal   Consent given by:  Patient   Risks, benefits, and alternatives were discussed: yes     Risks discussed:  Bleeding, incomplete drainage, pain, infection and damage to other organs   Alternatives discussed:  Referral Universal protocol:    Patient identity confirmed:  Verbally with patient and arm band Location:    Type:  Abscess   Location:  Anogenital   Anogenital location:  Scrotal wall Sedation:    Sedation type:  None Anesthesia:    Anesthesia method:  Local infiltration   Local anesthetic:  Lidocaine 1% w/o epi Procedure type:    Complexity:  Simple Procedure details:    Ultrasound guidance: no     Needle aspiration: no     Incision types:  Elliptical   Wound management:  Probed and deloculated   Drainage:  Purulent and bloody   Drainage amount:  Moderate   Wound treatment:  Wound left open   Packing materials:  None Post-procedure details:    Procedure completion:  Tolerated well,  no immediate complications    MEDICATIONS ORDERED IN ED: Medications  lidocaine (PF) (XYLOCAINE) 1 % injection 10 mL (10 mLs Intradermal Given by Other 03/17/23 1417)     IMPRESSION / MDM / ASSESSMENT AND PLAN / ED COURSE  I reviewed the triage vital signs and the nursing notes.                              52 y.o. male with no significant past medical history presents to the ED complaining of pain and swelling underneath his scrotum for the past week.  Patient's presentation is most consistent with acute, uncomplicated illness.  Differential diagnosis includes, but is not limited to, abscess, cellulitis, epididymitis, testicular torsion.  Patient well-appearing and in no acute distress, vital signs are unremarkable.  He has area of tenderness, induration, with  central fluctuance to the posterior portion of his scrotum which is consistent with abscess.  Area was anesthetized with lidocaine and I&D performed with significant drainage of pus.  No findings to suggest testicular torsion or epididymitis.  Patient appropriate for discharge home with urology follow-up, will prescribe course of Bactrim.  He was counseled to return to the ED for new or worsening symptoms, patient agrees with plan.      FINAL CLINICAL IMPRESSION(S) / ED DIAGNOSES   Final diagnoses:  Scrotal abscess     Rx / DC Orders   ED Discharge Orders          Ordered    sulfamethoxazole-trimethoprim (BACTRIM DS) 800-160 MG tablet  2 times daily        03/17/23 1435             Note:  This document was prepared using Dragon voice recognition software and may include unintentional dictation errors.   Chesley Noon, MD 03/17/23 [phone]

## 2023-03-17 NOTE — ED Triage Notes (Signed)
Pt comes with c/o groin pain for few days. Pt states bump noted to area of scrotum. Pt denies any urinary symptoms.

## 2023-03-17 NOTE — ED Notes (Signed)
See triage note  Presents with some pain to groin area  Noticed a small area couple days ago  Afebrile on arrival

## 2023-04-28 DIAGNOSIS — M5459 Other low back pain: Secondary | ICD-10-CM | POA: Diagnosis not present

## 2023-04-28 DIAGNOSIS — R030 Elevated blood-pressure reading, without diagnosis of hypertension: Secondary | ICD-10-CM | POA: Diagnosis not present

## 2023-04-28 DIAGNOSIS — N529 Male erectile dysfunction, unspecified: Secondary | ICD-10-CM | POA: Diagnosis not present

## 2023-04-28 DIAGNOSIS — F17210 Nicotine dependence, cigarettes, uncomplicated: Secondary | ICD-10-CM | POA: Diagnosis not present

## 2023-05-06 DIAGNOSIS — Z13228 Encounter for screening for other metabolic disorders: Secondary | ICD-10-CM | POA: Diagnosis not present

## 2023-05-06 DIAGNOSIS — Z1159 Encounter for screening for other viral diseases: Secondary | ICD-10-CM | POA: Diagnosis not present

## 2023-05-06 DIAGNOSIS — N529 Male erectile dysfunction, unspecified: Secondary | ICD-10-CM | POA: Diagnosis not present

## 2023-05-06 DIAGNOSIS — Z0000 Encounter for general adult medical examination without abnormal findings: Secondary | ICD-10-CM | POA: Diagnosis not present

## 2023-05-06 DIAGNOSIS — Z13220 Encounter for screening for lipoid disorders: Secondary | ICD-10-CM | POA: Diagnosis not present

## 2023-07-09 DIAGNOSIS — M2011 Hallux valgus (acquired), right foot: Secondary | ICD-10-CM | POA: Diagnosis not present

## 2023-07-09 DIAGNOSIS — M79674 Pain in right toe(s): Secondary | ICD-10-CM | POA: Diagnosis not present

## 2023-07-09 DIAGNOSIS — L600 Ingrowing nail: Secondary | ICD-10-CM | POA: Diagnosis not present

## 2023-07-09 DIAGNOSIS — M249 Joint derangement, unspecified: Secondary | ICD-10-CM | POA: Diagnosis not present

## 2023-07-11 DIAGNOSIS — M79674 Pain in right toe(s): Secondary | ICD-10-CM | POA: Diagnosis not present

## 2023-07-11 DIAGNOSIS — M79675 Pain in left toe(s): Secondary | ICD-10-CM | POA: Diagnosis not present

## 2023-07-11 DIAGNOSIS — L600 Ingrowing nail: Secondary | ICD-10-CM | POA: Diagnosis not present

## 2023-07-11 DIAGNOSIS — M2011 Hallux valgus (acquired), right foot: Secondary | ICD-10-CM | POA: Diagnosis not present

## 2023-07-11 DIAGNOSIS — L603 Nail dystrophy: Secondary | ICD-10-CM | POA: Diagnosis not present

## 2023-07-16 DIAGNOSIS — T7849XA Other allergy, initial encounter: Secondary | ICD-10-CM | POA: Diagnosis not present

## 2023-07-28 DIAGNOSIS — L600 Ingrowing nail: Secondary | ICD-10-CM | POA: Diagnosis not present

## 2023-07-28 DIAGNOSIS — L603 Nail dystrophy: Secondary | ICD-10-CM | POA: Diagnosis not present

## 2024-02-07 DIAGNOSIS — H02823 Cysts of right eye, unspecified eyelid: Secondary | ICD-10-CM | POA: Diagnosis not present

## 2024-02-07 DIAGNOSIS — L02411 Cutaneous abscess of right axilla: Secondary | ICD-10-CM | POA: Diagnosis not present

## 2024-02-16 DIAGNOSIS — Q828 Other specified congenital malformations of skin: Secondary | ICD-10-CM | POA: Diagnosis not present

## 2024-02-16 DIAGNOSIS — D2371 Other benign neoplasm of skin of right lower limb, including hip: Secondary | ICD-10-CM | POA: Diagnosis not present

## 2024-02-16 DIAGNOSIS — M79671 Pain in right foot: Secondary | ICD-10-CM | POA: Diagnosis not present

## 2024-02-16 DIAGNOSIS — L84 Corns and callosities: Secondary | ICD-10-CM | POA: Diagnosis not present

## 2024-04-23 DIAGNOSIS — D2372 Other benign neoplasm of skin of left lower limb, including hip: Secondary | ICD-10-CM | POA: Diagnosis not present

## 2024-04-23 DIAGNOSIS — Q828 Other specified congenital malformations of skin: Secondary | ICD-10-CM | POA: Diagnosis not present

## 2024-04-23 DIAGNOSIS — L84 Corns and callosities: Secondary | ICD-10-CM | POA: Diagnosis not present

## 2024-04-29 DIAGNOSIS — J069 Acute upper respiratory infection, unspecified: Secondary | ICD-10-CM | POA: Diagnosis not present

## 2024-04-29 DIAGNOSIS — R051 Acute cough: Secondary | ICD-10-CM | POA: Diagnosis not present

## 2024-04-29 DIAGNOSIS — B9689 Other specified bacterial agents as the cause of diseases classified elsewhere: Secondary | ICD-10-CM | POA: Diagnosis not present

## 2024-05-14 DIAGNOSIS — D2372 Other benign neoplasm of skin of left lower limb, including hip: Secondary | ICD-10-CM | POA: Diagnosis not present

## 2024-05-14 DIAGNOSIS — Q828 Other specified congenital malformations of skin: Secondary | ICD-10-CM | POA: Diagnosis not present

## 2024-05-14 DIAGNOSIS — M79672 Pain in left foot: Secondary | ICD-10-CM | POA: Diagnosis not present

## 2024-05-14 DIAGNOSIS — L84 Corns and callosities: Secondary | ICD-10-CM | POA: Diagnosis not present

## 2024-07-10 DIAGNOSIS — X58XXXA Exposure to other specified factors, initial encounter: Secondary | ICD-10-CM | POA: Diagnosis not present

## 2024-07-10 DIAGNOSIS — M5416 Radiculopathy, lumbar region: Secondary | ICD-10-CM | POA: Diagnosis not present

## 2024-07-10 DIAGNOSIS — S76212A Strain of adductor muscle, fascia and tendon of left thigh, initial encounter: Secondary | ICD-10-CM | POA: Diagnosis not present

## 2024-07-10 NOTE — Progress Notes (Signed)
 Subjective Patient ID: Stanley Lopez is a 53 y.o. male.    Patient presents for evaluation of 2 areas of concern.  Patient resumed exercising aggressively with a lot of sit ups.  He complains of left inguinal pain, no specific exacerbation during exercise but felt pain later.  Also complains of exacerbation of lower lumbar pain with radiculopathy on the left.  Patient has not taken any medications.  Patient works at [employer] here in [place]  and states that he is increasing his home training.   History provided by:  Patient Language interpreter used: No     Review of Systems  Musculoskeletal:  Positive for arthralgias, back pain and myalgias.    Patient History  Allergies: Allergies  Allergen Reactions  . Wound Dressing Adhesive   . Wound Dressings Other    History reviewed. No pertinent past medical history. Past Surgical History:  Procedure Laterality Date  . KNEE SURGERY     Social History   Socioeconomic History  . Marital status: Married    Spouse name: Not on file  . Number of children: Not on file  . Years of education: Not on file  . Highest education level: Not on file  Occupational History  . Not on file  Tobacco Use  . Smoking status: Some Days    Types: Cigars  . Smokeless tobacco: Never  Vaping Use  . Vaping status: Never Used  Substance and Sexual Activity  . Alcohol use: Never  . Drug use: Never  . Sexual activity: Defer  Other Topics Concern  . Not on file  Social History Narrative  . Not on file   Family History  Family history unknown: Yes   Current Outpatient Medications on File Prior to Visit  Medication Sig Dispense Refill  . hydrOXYzine HCl (Atarax) 10 MG tablet T 1-3 tab po q 4-6 hr prn 30 tablet 0   No current facility-administered medications on file prior to visit.    Objective  Vitals:   07/10/24 0818  BP: 137/86  BP Location: Left arm  Patient Position: Sitting  BP Cuff Size: Large adult  Pulse: (!) 56   Resp: 16  Temp: 36.6 C (97.8 F)  TempSrc: Oral  SpO2: 98%  Weight: 95.3 kg  Height: 5' 6  PainSc:   7               No results found.  Physical Exam Vitals and nursing note reviewed.  Constitutional:      General: He is not in acute distress.    Appearance: Normal appearance.  HENT:     Head: Normocephalic.     Nose: Nose normal.     Mouth/Throat:     Mouth: Mucous membranes are moist.  Eyes:     Extraocular Movements: Extraocular movements intact.     Conjunctiva/sclera: Conjunctivae normal.  Cardiovascular:     Rate and Rhythm: Normal rate.  Pulmonary:     Effort: Pulmonary effort is normal.  Abdominal:     General: There is no distension.  Musculoskeletal:        General: Tenderness present.     Cervical back: Normal range of motion.     Comments: Left inguinal: Patient tender over mid left inguinal groin area.,  No increased pain against resistance.  On inguinal hernia testing slight tenderness, but increased pain with cough, no palpable bulge.  Back: Tender in the lumbar area, radiating across lumbar line.  Slight radiculopathy symptoms with straight leg raise to buttock  thigh area. Sensation and circulation intact and comparable bilat. AROM intact,but diminished by pain.   Skin:    General: Skin is warm.  Neurological:     Mental Status: He is alert and oriented to person, place, and time.  Psychiatric:        Mood and Affect: Mood normal.        Behavior: Behavior normal.     No results found for this visit on 07/10/24.     Procedures MDM:     1 Acute complicated illness or injury     Explanation of Medical Decision Making and variances from expected care:  Multiple issues addressed and discussed.  Additional time with patient on explanation of findings, treatment management.   Assessment requiring historian other than patient: No     Independent visualization of image, tracing, or test: No     Discussion of management with another provider:  No     Risk:: Moderate            Assessment/Plan Diagnoses and all orders for this visit:  Inguinal strain, left, initial encounter -     ibuprofen 600 MG tablet; Take 1 tablet (600 mg total) by mouth every 6 (six) hours if needed for mild pain for up to 10 days. -     orphenadrine (Norflex) 100 MG 12 hr tablet; Take 1 tablet (100 mg total) by mouth 2 (two) times a day if needed for muscle spasms for up to 15 days. Do not crush, chew, or split.  Lumbar back pain with radiculopathy affecting left lower extremity -     ibuprofen 600 MG tablet; Take 1 tablet (600 mg total) by mouth every 6 (six) hours if needed for mild pain for up to 10 days. -     orphenadrine (Norflex) 100 MG 12 hr tablet; Take 1 tablet (100 mg total) by mouth 2 (two) times a day if needed for muscle spasms for up to 15 days. Do not crush, chew, or split.     Disposition Status: Home  There are no Patient Instructions on file for this visit.  Progress note signed by Debby Rung, PA on 07/10/24 at  9:45 AM

## 2024-07-13 ENCOUNTER — Encounter: Payer: Self-pay | Admitting: Emergency Medicine

## 2024-07-13 ENCOUNTER — Emergency Department

## 2024-07-13 ENCOUNTER — Emergency Department
Admission: EM | Admit: 2024-07-13 | Discharge: 2024-07-14 | Disposition: A | Discharge status: Home / Self Care | Attending: Emergency Medicine | Admitting: Emergency Medicine

## 2024-07-13 ENCOUNTER — Other Ambulatory Visit: Payer: Self-pay

## 2024-07-13 DIAGNOSIS — R1032 Left lower quadrant pain: Secondary | ICD-10-CM | POA: Insufficient documentation

## 2024-07-13 DIAGNOSIS — R2242 Localized swelling, mass and lump, left lower limb: Secondary | ICD-10-CM | POA: Diagnosis not present

## 2024-07-13 DIAGNOSIS — R239 Unspecified skin changes: Secondary | ICD-10-CM | POA: Diagnosis not present

## 2024-07-13 DIAGNOSIS — R1022 Pelvic and perineal pain left side: Secondary | ICD-10-CM | POA: Diagnosis not present

## 2024-07-13 MED ORDER — IBUPROFEN 600 MG PO TABS
600.0000 mg | ORAL_TABLET | Freq: Once | ORAL | Status: AC
  Administered 2024-07-13: 600 mg via ORAL
  Filled 2024-07-13: qty 1

## 2024-07-13 NOTE — ED Triage Notes (Addendum)
 Pt arrives POV ambulatory to triage, gait steady, no acute distress noted c/o  left inguinal area that radiates into testicles and left leg since Friday; denies injury. Pt has been seen by UC and PCP who wanted to set up imaging for patient but unsure when appointment could be made so pt came here for imaging of possible hernia.

## 2024-07-13 NOTE — Discharge Instructions (Addendum)
 You have been diagnosed with left inguinal pain.  Please take tramadol prescribed by your PCP.  Please call surgery tomorrow make an appointment for a follow-up.  You can check on MyChart for results of your ultrasound.  Please drink plenty of fluids.  Please come back to ED or go to your PCP if you have new symptoms or symptoms worsen.  It was a pleasure to help you today.  Itali Mckendry, PA-C.

## 2024-07-13 NOTE — ED Provider Notes (Signed)
 [facility] Provider Note    Event Date/Time   First MD Initiated Contact with Patient 07/13/24 2024     (approximate)   History   Groin Pain    HPI  Stanley Lopez is a 53 y.o. male    with a past medical history of inguinal strain, bacterial upper respiratory infection, scrotal abscess, who presents to the ED complaining of pain. According to the patient, symptoms started 5 days ago with left groin pain after going to the gym.  Patient endorses pain radiates to the left testicle, sometimes left leg.  Patient was seen in urgent care on Saturday, they diagnosed inguinal hernia and referred him to his PCP.  Patient was able to see his PCP today at 2:30 PM who recommended to have imaging.  She said imaging will take a week on her facility.  Patient is concerned about the pain and wanted to come here to have imaging and confirm the diagnosis.  Patient works as a cabin crew and states the belt with a gun increases the pain on his left groin.  Patient denies fever, abdominal pain, cramps, diarrhea or urinary symptoms.    There are no active problems to display for this patient.    Physical Exam   Triage Vital Signs: ED Triage Vitals  Encounter Vitals Group     BP 07/13/24 1937 (!) 134/98     Girls Systolic BP Percentile --      Girls Diastolic BP Percentile --      Boys Systolic BP Percentile --      Boys Diastolic BP Percentile --      Pulse Rate 07/13/24 1937 78     Resp 07/13/24 1937 20     Temp 07/13/24 1937 98.1 F (36.7 C)     Temp Source 07/13/24 1937 Oral     SpO2 07/13/24 1937 96 %     Weight 07/13/24 1938 207 lb (93.9 kg)     Height 07/13/24 1938 5' 6 (1.676 m)     Head Circumference --      Peak Flow --      Pain Score 07/13/24 1943 7     Pain Loc --      Pain Education --      Exclude from Growth Chart --     Most recent vital signs: Vitals:   07/13/24 1937  BP: (!) 134/98  Pulse: 78  Resp: 20  Temp: 98.1 F (36.7 C)  SpO2: 96%      Physical Exam Vitals and nursing note reviewed.  In triage patient was hypertensive  General:          Awake, no distress.  CV:                  Good peripheral perfusion.  Resp:               Normal effort. no tachypnea Abd:                 Bowel sounds positive, skin without scars.  No distention.  Soft nontender Other:           Left inguinal area: Skin is intact, tenderness to palpation at the level of the inguinal ring, palpated a protrusion that was able to reduce, ring was about 1 cm of diameter.  No painful.  When asked patient to cough I did not feel increase of the protrusion coming from the inguinal ring.    ED Results /  Procedures / Treatments   Labs (all labs ordered are listed, but only abnormal results are displayed) Labs Reviewed - No data to display   RADIOLOGY I independently reviewed and interpreted imaging and agree with radiologists findings.      PROCEDURES:  Critical Care performed:   Procedures   MEDICATIONS ORDERED IN ED: Medications  ibuprofen (ADVIL) tablet 600 mg (600 mg Oral Given 07/13/24 2236)   Clinical Course as of 07/14/24 0000  Tue Jul 13, 2024  2233 Patient is waiting for ultrasound, patient states having left groin pain.  I will order ibuprofen [AE]    Clinical Course User Index [AE] Akshita Italiano, PA-C    IMPRESSION / MDM / ASSESSMENT AND PLAN / ED COURSE  I reviewed the triage vital signs and the nursing notes.  Differential diagnosis includes, but is not limited to, hernia, prostatitis, testicular torsion, radiculopathy, muscle strain  Patient's presentation is most consistent with acute complicated illness / injury requiring diagnostic workup.   [name] is a 53 y.o., male who presents today with history of 5 days of left inguinal pain after going to the gym.  Patient was seen in urgent care and also by his PCP who diagnosed inguinal hernia.  On a physical exam patient is stable, abdomen bowel sounds positive,  skin without signs of old scars, no ecchymosis no hematomas.  Not distended not tender to palpation.  Left inguinal area, tenderness to palpation in the inguinal ring, protrusion was noted and easy to reduce.  Valsalva maneuver did not increase the protrusion through the ring. Plan Inguinal ultrasound Reassess Reassessed after having inguinal ultrasound.  Report of the inguinal ultrasound is pending. Patient's diagnosis is consistent with left groin pain I independently reviewed and interpreted imaging and agree with radiologists findings. I did review the patient's allergies and medications.The patient is in stable and satisfactory condition for discharge home  Patient will be discharged home without prescriptions.  Patient endorses his PCP prescribed today tramadol. Patient is to follow up with surgery as needed or otherwise directed. Patient is given ED precautions to return to the ED for any worsening or new symptoms. Discussed plan of care with patient, answered all of patient's questions, Patient agreeable to plan of care. Advised patient to take medications according to the instructions on the label. Discussed possible side effects of new medications. Patient verbalized understanding.  FINAL CLINICAL IMPRESSION(S) / ED DIAGNOSES   Final diagnoses:  Left inguinal pain     Rx / DC Orders   ED Discharge Orders     None        Note:  This document was prepared using Dragon voice recognition software and may include unintentional dictation errors.   Janit Kast, PA-C 07/14/24 0000    Suzanne Kirsch, MD 07/26/24 1709

## 2024-07-13 NOTE — ED Notes (Signed)
 Pt stepped out of his room requesting an updated. Pt informed we are awaiting US  technician for imaging. Unable to provide timeline. Apologized for delay. Denies any additional needs at this time

## 2024-07-14 DIAGNOSIS — R1032 Left lower quadrant pain: Secondary | ICD-10-CM | POA: Diagnosis not present

## 2024-07-15 DIAGNOSIS — Z23 Encounter for immunization: Secondary | ICD-10-CM | POA: Diagnosis not present

## 2024-07-15 DIAGNOSIS — R1030 Lower abdominal pain, unspecified: Secondary | ICD-10-CM | POA: Diagnosis not present

## 2024-07-28 ENCOUNTER — Ambulatory Visit: Admitting: Surgery

## 2024-08-26 DIAGNOSIS — M545 Low back pain, unspecified: Secondary | ICD-10-CM | POA: Diagnosis not present
